# Patient Record
Sex: Female | Born: 1945
Health system: Southern US, Community
[De-identification: ages and names within clinical notes are randomized; demographics above are authoritative.]

## PROBLEM LIST (undated history)

## (undated) DIAGNOSIS — K219 Gastro-esophageal reflux disease without esophagitis: Secondary | ICD-10-CM

## (undated) DIAGNOSIS — I6529 Occlusion and stenosis of unspecified carotid artery: Secondary | ICD-10-CM

## (undated) DIAGNOSIS — E669 Obesity, unspecified: Secondary | ICD-10-CM

## (undated) DIAGNOSIS — I10 Essential (primary) hypertension: Secondary | ICD-10-CM

## (undated) DIAGNOSIS — E785 Hyperlipidemia, unspecified: Secondary | ICD-10-CM

## (undated) DIAGNOSIS — C801 Malignant (primary) neoplasm, unspecified: Secondary | ICD-10-CM

## (undated) HISTORY — DX: Essential (primary) hypertension: I10

## (undated) HISTORY — DX: Obesity, unspecified: E66.9

## (undated) HISTORY — DX: Hyperlipidemia, unspecified: E78.5

## (undated) HISTORY — DX: Occlusion and stenosis of unspecified carotid artery: I65.29

## (undated) HISTORY — PX: ABDOMINAL HYSTERECTOMY: SHX81

---

## 2001-05-28 ENCOUNTER — Other Ambulatory Visit: Admission: RE | Admit: 2001-05-28 | Discharge: 2001-05-28 | Payer: Self-pay | Admitting: Family Medicine

## 2001-06-08 ENCOUNTER — Ambulatory Visit (HOSPITAL_COMMUNITY): Admission: RE | Admit: 2001-06-08 | Discharge: 2001-06-08 | Payer: Self-pay | Admitting: General Surgery

## 2002-06-20 ENCOUNTER — Emergency Department (HOSPITAL_COMMUNITY): Admission: EM | Admit: 2002-06-20 | Discharge: 2002-06-20 | Payer: Self-pay | Admitting: *Deleted

## 2004-07-23 ENCOUNTER — Ambulatory Visit (HOSPITAL_COMMUNITY): Admission: RE | Admit: 2004-07-23 | Discharge: 2004-07-23 | Payer: Self-pay | Admitting: Family Medicine

## 2005-12-29 HISTORY — PX: CAROTID ENDARTERECTOMY: SUR193

## 2006-09-14 ENCOUNTER — Emergency Department (HOSPITAL_COMMUNITY): Admission: EM | Admit: 2006-09-14 | Discharge: 2006-09-14 | Payer: Self-pay | Admitting: *Deleted

## 2006-09-15 ENCOUNTER — Ambulatory Visit (HOSPITAL_COMMUNITY): Admission: RE | Admit: 2006-09-15 | Discharge: 2006-09-15 | Payer: Self-pay | Admitting: *Deleted

## 2006-10-09 ENCOUNTER — Encounter (INDEPENDENT_AMBULATORY_CARE_PROVIDER_SITE_OTHER): Payer: Self-pay | Admitting: *Deleted

## 2006-10-09 ENCOUNTER — Ambulatory Visit (HOSPITAL_COMMUNITY): Admission: RE | Admit: 2006-10-09 | Discharge: 2006-10-10 | Payer: Self-pay | Admitting: Vascular Surgery

## 2007-07-05 ENCOUNTER — Ambulatory Visit: Payer: Self-pay | Admitting: Vascular Surgery

## 2007-08-17 ENCOUNTER — Ambulatory Visit (HOSPITAL_COMMUNITY): Admission: RE | Admit: 2007-08-17 | Discharge: 2007-08-17 | Payer: Self-pay | Admitting: Internal Medicine

## 2008-06-03 ENCOUNTER — Emergency Department (HOSPITAL_COMMUNITY): Admission: EM | Admit: 2008-06-03 | Discharge: 2008-06-03 | Payer: Self-pay | Admitting: Emergency Medicine

## 2008-07-14 ENCOUNTER — Ambulatory Visit: Payer: Self-pay | Admitting: Vascular Surgery

## 2008-12-08 ENCOUNTER — Ambulatory Visit (HOSPITAL_COMMUNITY): Admission: RE | Admit: 2008-12-08 | Discharge: 2008-12-08 | Payer: Self-pay | Admitting: Internal Medicine

## 2009-07-13 ENCOUNTER — Ambulatory Visit: Payer: Self-pay | Admitting: Vascular Surgery

## 2009-12-03 ENCOUNTER — Emergency Department (HOSPITAL_COMMUNITY): Admission: EM | Admit: 2009-12-03 | Discharge: 2009-12-03 | Payer: Self-pay | Admitting: Emergency Medicine

## 2009-12-07 ENCOUNTER — Ambulatory Visit (HOSPITAL_COMMUNITY): Admission: RE | Admit: 2009-12-07 | Discharge: 2009-12-07 | Payer: Self-pay | Admitting: Emergency Medicine

## 2010-06-17 ENCOUNTER — Ambulatory Visit (HOSPITAL_COMMUNITY): Admission: RE | Admit: 2010-06-17 | Discharge: 2010-06-17 | Payer: Self-pay | Admitting: Internal Medicine

## 2011-05-13 NOTE — Procedures (Signed)
CAROTID DUPLEX EXAM   INDICATION:  Followup of carotid artery disease.   HISTORY:  Diabetes:  No  Cardiac:  No  Hypertension:  No  Smoking:  Yes  Previous Surgery:  Left CEA with DPA in 2007  CV History:  No  Amaurosis Fugax No, Paresthesias No, Hemiparesis No                                       RIGHT             LEFT  Brachial systolic pressure:         171               173  Brachial Doppler waveforms:         Biphasic          Biphasic  Vertebral direction of flow:        Antegrade         Antegrade  DUPLEX VELOCITIES (cm/sec)  CCA peak systolic                   95                101  ECA peak systolic                   205               236  ICA peak systolic                   121               96  ICA end diastolic                   46                46  PLAQUE MORPHOLOGY:                  Mixed             Mixed in ECA  PLAQUE AMOUNT:                      Mild              Mild  PLAQUE LOCATION:                    Right ICA/ECA     ECA   IMPRESSION:  1. The right internal carotid artery shows evidence of 40-59%      stenosis.  2. The left internal carotid artery shows no evidence of restenosis,      status post carotid endarterectomy.  3. Evidence of bilateral external carotid artery stenosis.   ___________________________________________  Larina Earthly, M.D.   AS/MEDQ  D:  07/05/2007  T:  07/06/2007  Job:  [id number]

## 2011-05-13 NOTE — Procedures (Signed)
CAROTID DUPLEX EXAM   INDICATION:  Follow-up evaluation of known carotid artery disease.   HISTORY:  Diabetes:  No.  Cardiac:  No.  Hypertension:  No.  Smoking:  Five cigarettes per day.  Previous Surgery:  Left carotid endarterectomy with Dacron patch  angioplasty on October 09, 2006 by Dr. Arbie Cookey.  CV History:  On July 05, 2007, patient had a 40-59% right ICA stenosis  and no left ICA stenosis, status post endarterectomy.  Patient complains  of no cerebrovascular symptoms at this time.  Amaurosis Fugax No, Paresthesias No, Hemiparesis No.                                       RIGHT             LEFT  Brachial systolic pressure:         180               188  Brachial Doppler waveforms:         Triphasic         Triphasic  Vertebral direction of flow:        Antegrade         Antegrade  DUPLEX VELOCITIES (cm/sec)  CCA peak systolic                   97                121  ECA peak systolic                   222               142  ICA peak systolic                   115               119  ICA end diastolic                   44                31  PLAQUE MORPHOLOGY:                  Soft              Mixed  PLAQUE AMOUNT:                      Mild-to-moderate  Mild  PLAQUE LOCATION:                    Proximal ICA      Proximal ECA, ICA   IMPRESSION:  1. 40-59% right internal carotid artery stenosis.  2. 20-39% left internal carotid artery stenosis, status post      endarterectomy.  3. Right external carotid artery stenosis.   ___________________________________________  Larina Earthly, M.D.   MC/MEDQ  D:  07/14/2008  T:  07/14/2008  Job:  [id number]

## 2011-05-13 NOTE — Procedures (Signed)
CAROTID DUPLEX EXAM   INDICATION:  Carotid disease.   HISTORY:  Diabetes:  No.  Cardiac:  No.  Hypertension:  No.  Smoking:  Yes.  Previous Surgery:  Left carotid endarterectomy in 10/09/2006.  CV History:  Currently asymptomatic.  Amaurosis Fugax No, Paresthesias No, Hemiparesis No.                                       RIGHT             LEFT  Brachial systolic pressure:         72                170  Brachial Doppler waveforms:         Normal            Normal  Vertebral direction of flow:        Antegrade         Antegrade  DUPLEX VELOCITIES (cm/sec)  CCA peak systolic                   118               119  ECA peak systolic                   313               208  ICA peak systolic                   150               105  ICA end diastolic                   60                25  PLAQUE MORPHOLOGY:                  Mixed             Heterogeneous  PLAQUE AMOUNT:                      Moderate          Moderate  PLAQUE LOCATION:                    ICA/ECA           ECA   IMPRESSION:  1. A 40% to 59% stenosis of the right internal carotid artery.  2. Patent left carotid endarterectomy site with no evidence of      stenosis in the left internal carotid artery.  3. Bilateral external carotid artery stenoses noted.  4. No significant change noted when compared to the previous exams.   ___________________________________________  Larina Earthly, M.D.   CH/MEDQ  D:  07/16/2009  T:  07/17/2009  Job:  [id number]

## 2011-05-13 NOTE — Assessment & Plan Note (Signed)
OFFICE VISIT   Taylor Banks  DOB:  06-03-1946                                       07/05/2007  MWNUU#:[id number]   The patient presents today for evaluation and followup of her left  carotid endarterectomy, Dacron patch angioplasty on October 09, 2006.  She seems to be doing quite well with no neurological deficits.  She has  dramatically reduced her cigarette smoking, but has not entirely quit  cigarette smoking.  She has had no neurological deficits, specifically  no amaurosis fugax, change in TIA, or strokes.  She does continue to  have elevated blood pressure, but is not currently on any medication  with borderline hypertension.   PHYSICAL EXAM:  She is a well-developed, well-nourished white female  appearing stated age of 35.  Blood pressure is 164/93, pulse 78,  respirations 16.  Radial pulses 2+ bilaterally.  She has a well-healed left carotid incision with no bruits bilaterally.   She underwent repeat carotid duplex, which reveals no evidence of  stenosis in her left carotid system.  She has moderate 40-59% stenosis  in her right carotid system.   I have, again, instructed her on the importance of cigarette smoking  cessation.  She will continue to work on this.  We plan to see her again  in 1 year with carotid duplex followup.  She will notify us if she  develops any neurological deficits.   Taylor Banks, M.D.  Electronically Signed   TFE/MEDQ  D:  07/05/2007  T:  07/06/2007  Job:  [id number]

## 2011-05-16 NOTE — H&P (Signed)
NAMETAITE, BALDASSARI NO.:  [id number]   MEDICAL RECORD NO.:  [id number]          PATIENT TYPE:  AMB   LOCATION:  SDS                          FACILITY:  [facility]   PHYSICIAN:  Larina Earthly, M.D.    DATE OF BIRTH:  [birth year]   DATE OF ADMISSION:  10/09/2006  DATE OF DISCHARGE:                                HISTORY & PHYSICAL   CHIEF COMPLAINT:  Asymptomatic left ICA stenosis.   HISTORY OF PRESENT ILLNESS:  The patient is a pleasant 65 year old Caucasian  female with a past medical history of hyperlipidemia and tobacco use.  The  patient recently presented to the emergency department complaining of severe  pain behind her right ear.  At that time, the patient was found to have a  carotid bruit.  In the emergency room, carotid duplex ultrasound was ordered  and done, which revealed mild to moderate right and severe left internal  carotid artery stenosis.  Following this, the patient presented back to her  primary care physician who placed her on Plavix and referred the patient to  Dr. Arbie Cookey.  The patient denies any headaches, nausea, vomiting, vertigo,  dizziness, history of seizures, numbness, tingling, muscle weakness,  dysarthria or dysphagia, visual changes, syncope, pre-syncope, memory loss  or confusion.  She was seen and evaluated by Dr. Arbie Cookey on Monday, October 05, 2006.   PAST MEDICAL HISTORY:  Hyperlipidemia.   PAST SURGICAL HISTORY:  Status post hysterectomy.   ALLERGIES:  NO KNOWN DRUG ALLERGIES.   MEDICATIONS:  1. Plavix 75 mg daily; stopped October 05, 2006.  2. Aspirin 81 mg daily.  3. __________ daily.  4. Vytorin daily.   SOCIAL HISTORY:  The patient is married and has 3 children.  Lives at home  with family.  History of tobacco use; she has smoked 1 pack per day over the  past 35 years.  Denies any alcohol use.  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   FAMILY HISTORY:  Mother deceased at age 86, positive for  hypertension,  cerebrovascular accident and diabetes mellitus.  Father deceased at age 55,  positive for myocardial infarction.   REVIEW OF SYSTEMS:  See HPI for pertinent positives and negatives.  The  patient denies any recent fevers, night sweats or chills.  Denies any recent  changes in vision or hearing, or difficulty swallowing.  Denies any  coughing, hemoptysis or wheezing.  Denies any chest pain, palpitations,  lightheadedness, dizziness, orthopnea, or paroxysmal nocturnal dyspnea.  Denies any nausea, vomiting, abdominal pain, changes in bowel movements,  diarrhea, constipation, melena, hematemesis or hematochezia.  Denies any  urgency, frequency, dysuria or hematuria.  Denies any muscle weakness,  aches, or claudication symptoms.   PHYSICAL EXAMINATION:  GENERAL:  Well-developed, well-nourished white female  in no acute distress.  VITAL SIGNS:  Blood pressure is 140/82, pulse is 78, and respirations are  18.  HEENT:  Normocephalic, atraumatic.  Pupils equal, round and reactive to  light and accommodation.  Extraocular movements are intact.  Oral mucosa is  pink and moist.  NECK:  Supple.  There are bilateral  carotid bruits noted.  RESPIRATORY:  Clear to auscultation bilaterally.  CARDIAC:  Regular rate and rhythm.  S1 and S2 noted.  There is a 2/6  systolic ejection murmur noted.  ABDOMEN:  Bowel sounds times four.  Soft and nontender to palpation.  No  hepatosplenomegaly noted.  GENITOURINARY:  Deferred.  RECTAL:  Deferred.  EXTREMITIES:  No edema, cyanosis or clubbing noted.  Patient has bilateral  warm extremities.  Has 2+ bilateral radial, femoral, dorsalis pedis and  posterior tibial pulses noted.  NEUROLOGIC:  Cranial nerves II through XII are intact.  The patient is alert  and oriented times four.  Gait is steady.  Muscle strength is 5/5 in upper  and lower extremities bilaterally.   STUDIES:  Carotid duplex ultrasound results are currently not available.    IMPRESSION AND PLAN:  The patient is seen with left internal carotid artery  stenosis.  She was seen and evaluated per Dr. Arbie Cookey.  Dr. Arbie Cookey discussed  with the patient undergoing left carotid endarterectomy.  He discussed risks  and benefits of this procedure with the patient.  The patient acknowledged  her understanding and agreed to proceed.  Surgery was scheduled for October 09, 2006.  The patient has undergone her preoperative testing this morning,  October 08, 2006.   The patient was instructed that should she develop any sudden weakness,  slurred speech, symptoms of amaurosis fugax, she should present to the  emergency room.  She acknowledged her understanding.      Theda [place], [place]      Larina Earthly, M.D.  Electronically Signed    KMD/MEDQ  D:  10/08/2006  T:  10/08/2006  Job:  [id number]

## 2011-05-16 NOTE — Op Note (Signed)
NAMEJENIYAH, Taylor Banks NO.:  [id number]   MEDICAL RECORD NO.:  [id number]          PATIENT TYPE:  INP   LOCATION:  3302                         FACILITY:  [facility]   PHYSICIAN:  Larina Earthly, M.D.    DATE OF BIRTH:  Jul 11, 1946   DATE OF PROCEDURE:  10/09/2006  DATE OF DISCHARGE:  10/10/2006                                 OPERATIVE REPORT   PREOPERATIVE DIAGNOSIS:  Severe asymptomatic left internal carotid artery  stenosis.   POSTOPERATIVE DIAGNOSIS:  Severe asymptomatic left internal carotid artery  stenosis.   PROCEDURES:  Left carotid endarterectomy and Dacron patch angioplasty.   SURGEON:  Larina Earthly, M.D.   ASSISTANT:  Coral Ceo, PA-C.   ANESTHESIA:  General endotracheal.   COMPLICATIONS:  None.  To recovery room stable.   PROCEDURE IN DETAIL:  The patient was taken to the OR and positioned in the  supine position, where the area of the left neck was prepped and draped in  usual sterile fashion.  An incision was made at the anterior  sternocleidomastoid and carried down through the platysma with  electrocautery __________ sternocleidomastoid posteriorly.  The carotid  sheath was opened.  The facial vein was ligated with 2-0 silk ties and  divided.  The hypoglossal and vagus nerves were identified and preserved.  Dissection was tented onto the bifurcation.  The common carotid artery was  encircled with umbilical tape and Rumel tourniquet.  The external carotid  was encircled with a vessel loop.  The internal carotid circled with  umbilical tape and Rumel tourniquet, and the superior thyroid artery was  encircled with a 2-0 silk Potts tie.  The patient was given 7000 units of  intravenous heparin.  After adequate circulation time, the internal,  external and common carotid arteries were occluded.  The common carotid  artery was opened with a #11 blade and extended longitudinally with Potts  scissors through the plaque onto the internal carotid  artery.  A 10 shunt  was passed up the internal carotid, allowed to back bleed; then down the  common carotid, where it was secured with Rumel tourniquets.  The patient  did have severe stenosis at the bifurcation.   Endarterectomy was begun on the common carotid artery, and the plaque was  divided proximally with Potts scissors.  The endarterectomy was continued on  to bifurcation the external carotid endarterectomy was a everse technique.  The internal carotid endarterectomized in open fashion.  Remaining  atheromatous debris was removed from the endarterectomy plane.  A Finesse  Hemashield Dacron patch was brought into the field, and sewn as a patch  angioplasty with a running 6-0 Prolene suture.  Prior to completion of the  anastomosis, the shunt was removed and the usual flush maneuvers were  undertaken.  The anastomosis was then completed and the flow restored first  to the external, and then the common; finally the internal carotid occlusion  clamp was removed.  Excellent flow  characteristics were noted with handheld Doppler in the internal and  external carotid arteries.  The patient was given 50 mg of protamine to  reverse heparin.  The wounds were irrigated with saline and antiseptic  cautery.  The wounds were closed with 3-0 Vicryl in the subcutaneous and  subcuticular tissue.  Benzoin and Steri-Strips were applied.      Larina Earthly, M.D.  Electronically Signed     TFE/MEDQ  D:  10/09/2006  T:  10/11/2006  Job:  [id number]

## 2011-08-25 ENCOUNTER — Other Ambulatory Visit (HOSPITAL_COMMUNITY): Payer: Self-pay | Admitting: Internal Medicine

## 2011-08-25 DIAGNOSIS — Z139 Encounter for screening, unspecified: Secondary | ICD-10-CM

## 2011-09-02 ENCOUNTER — Ambulatory Visit (HOSPITAL_COMMUNITY)
Admission: RE | Admit: 2011-09-02 | Discharge: 2011-09-02 | Disposition: A | Discharge status: Home / Self Care | Payer: Medicare Other | Source: Ambulatory Visit | Attending: Internal Medicine | Admitting: Internal Medicine

## 2011-09-02 DIAGNOSIS — Z139 Encounter for screening, unspecified: Secondary | ICD-10-CM

## 2011-09-02 DIAGNOSIS — Z1231 Encounter for screening mammogram for malignant neoplasm of breast: Secondary | ICD-10-CM | POA: Insufficient documentation

## 2012-11-22 ENCOUNTER — Other Ambulatory Visit (HOSPITAL_COMMUNITY): Payer: Self-pay | Admitting: Internal Medicine

## 2012-11-22 DIAGNOSIS — Z139 Encounter for screening, unspecified: Secondary | ICD-10-CM

## 2012-11-23 ENCOUNTER — Ambulatory Visit (HOSPITAL_COMMUNITY)
Admission: RE | Admit: 2012-11-23 | Discharge: 2012-11-23 | Disposition: A | Discharge status: Home / Self Care | Payer: Medicare Other | Source: Ambulatory Visit | Attending: Internal Medicine | Admitting: Internal Medicine

## 2012-11-23 DIAGNOSIS — Z1231 Encounter for screening mammogram for malignant neoplasm of breast: Secondary | ICD-10-CM | POA: Insufficient documentation

## 2012-11-23 DIAGNOSIS — Z139 Encounter for screening, unspecified: Secondary | ICD-10-CM

## 2012-11-23 HISTORY — PX: DIAGNOSTIC MAMMOGRAM: HXRAD719

## 2012-12-24 ENCOUNTER — Encounter: Payer: Self-pay | Admitting: Vascular Surgery

## 2013-01-04 ENCOUNTER — Other Ambulatory Visit (HOSPITAL_COMMUNITY): Payer: Self-pay | Admitting: Internal Medicine

## 2013-01-04 DIAGNOSIS — Z9889 Other specified postprocedural states: Secondary | ICD-10-CM

## 2013-01-04 DIAGNOSIS — R0989 Other specified symptoms and signs involving the circulatory and respiratory systems: Secondary | ICD-10-CM

## 2013-01-04 DIAGNOSIS — E669 Obesity, unspecified: Secondary | ICD-10-CM

## 2013-01-04 DIAGNOSIS — Z0000 Encounter for general adult medical examination without abnormal findings: Secondary | ICD-10-CM

## 2013-01-04 HISTORY — DX: Obesity, unspecified: E66.9

## 2013-01-07 ENCOUNTER — Ambulatory Visit (HOSPITAL_COMMUNITY)
Admission: RE | Admit: 2013-01-07 | Discharge: 2013-01-07 | Disposition: A | Discharge status: Home / Self Care | Payer: Medicare Other | Source: Ambulatory Visit | Attending: Internal Medicine | Admitting: Internal Medicine

## 2013-01-07 DIAGNOSIS — M949 Disorder of cartilage, unspecified: Secondary | ICD-10-CM | POA: Insufficient documentation

## 2013-01-07 DIAGNOSIS — Z780 Asymptomatic menopausal state: Secondary | ICD-10-CM | POA: Insufficient documentation

## 2013-01-07 DIAGNOSIS — R0989 Other specified symptoms and signs involving the circulatory and respiratory systems: Secondary | ICD-10-CM | POA: Insufficient documentation

## 2013-01-07 DIAGNOSIS — I6529 Occlusion and stenosis of unspecified carotid artery: Secondary | ICD-10-CM | POA: Insufficient documentation

## 2013-01-07 DIAGNOSIS — Z9889 Other specified postprocedural states: Secondary | ICD-10-CM

## 2013-01-07 DIAGNOSIS — Z0000 Encounter for general adult medical examination without abnormal findings: Secondary | ICD-10-CM

## 2013-01-07 DIAGNOSIS — M899 Disorder of bone, unspecified: Secondary | ICD-10-CM | POA: Insufficient documentation

## 2013-01-14 ENCOUNTER — Encounter: Payer: Self-pay | Admitting: Vascular Surgery

## 2013-01-31 ENCOUNTER — Encounter: Payer: Self-pay | Admitting: Vascular Surgery

## 2013-02-01 ENCOUNTER — Encounter: Payer: Self-pay | Admitting: Vascular Surgery

## 2013-02-01 ENCOUNTER — Ambulatory Visit (INDEPENDENT_AMBULATORY_CARE_PROVIDER_SITE_OTHER): Payer: Medicare Other | Admitting: Vascular Surgery

## 2013-02-01 VITALS — BP 177/83 | HR 67 | Resp 18 | Ht 61.0 in | Wt 169.7 lb

## 2013-02-01 DIAGNOSIS — I6529 Occlusion and stenosis of unspecified carotid artery: Secondary | ICD-10-CM

## 2013-02-01 NOTE — Progress Notes (Signed)
[facility]   Patient name: Taylor Banks MRN: [id number] DOB: 11-Aug-1946 Sex: female   Referred by: Sherwood Gambler  Reason for referral:  Chief Complaint  Patient presents with  . Follow-up  . Carotid    HISTORY OF PRESENT ILLNESS: Patient is a very pleasant 67 year old female who I know from her prior left carotid endarterectomy for severe asymptomatic carotid disease in October of 2007. She did well following the surgery and had been seen her office for several years for followup of her moderate asymptomatic right carotid stenosis. She had been lost to followup from [place] since 2010. She reports that she had a period of time when she did not have insurance before beginning to proceed Medicare benefits and this continued followup. She was recently seen by Dr.Fusco who noted a right carotid bruit and scheduled carotid duplex. This was in January 2014 and I have her study for review. This does show no significant change since our last visit with her and October of 2010 with moderate right carotid stenosis. She continues to have widely patent left carotid endarterectomy. She specifically denies any gross fugax transient ischemic attacks or strokes. He has a remained stable with hypertension and hyperlipidemia. She reports that she checks her blood pressure at home but remains in the 01/28/1939 range systolic  Past Medical History  Diagnosis Date  . Obesity Jan. 7, 2014  . Hyperlipidemia   . Hypertension   . Carotid artery occlusion     with Bruit- Left and 50% stenosis right  carotid    Past Surgical History  Procedure Date  . Carotid endarterectomy     LEFT w/Bruit  . Diagnostic mammogram Nov. 26,2013  . Abdominal hysterectomy     Partial    History   Social History  . Marital Status: Married    Spouse Name: N/A    Number of Children: N/A  . Years of Education: N/A   Occupational History  . Not on file.   Social History Main Topics  . Smoking  status: Former Smoker -- 0.5 packs/day for 3 years    Types: Cigarettes    Quit date: 12/29/2012  . Smokeless tobacco: Never Used  . Alcohol Use: No  . Drug Use: No  . Sexually Active:    Other Topics Concern  . Not on file   Social History Narrative  . No narrative on file    Family History  Problem Relation Age of Onset  . Stroke Mother   . Stroke Father   . Diabetes Sister   . Diabetes Sister     Allergies as of 02/01/2013  . (No Known Allergies)    Current Outpatient Prescriptions on File Prior to Visit  Medication Sig Dispense Refill  . alendronate (FOSAMAX) 35 MG tablet Take 35 mg by mouth every 7 (seven) days. Take with a full glass of water on an empty stomach.      Marland Kitchen aspirin 325 MG tablet Take 325 mg by mouth daily.      . pravastatin (PRAVACHOL) 20 MG tablet Take 20 mg by mouth daily.         REVIEW OF SYSTEMS:  Positives indicated with an "X"  CARDIOVASCULAR:  [ ]  chest pain   [ ]  chest pressure   [ ]  palpitations   [ ]  orthopnea   [ ]  dyspnea on exertion   [ ]  claudication   [ ]  rest pain   [ ]  DVT   [ ]  phlebitis  PULMONARY:   [ ]  productive cough   [ ]  asthma   [ ]  wheezing NEUROLOGIC:   [ ]  weakness  [ ]  paresthesias  [ ]  aphasia  [ ]  amaurosis  [ ]  dizziness HEMATOLOGIC:   [ ]  bleeding problems   [ ]  clotting disorders MUSCULOSKELETAL:  [ ]  joint pain   [ ]  joint swelling GASTROINTESTINAL: [ ]   blood in stool  [ ]   hematemesis GENITOURINARY:  [ ]   dysuria  [ ]   hematuria PSYCHIATRIC:  [ ]  history of major depression INTEGUMENTARY:  [ ]  rashes  [ ]  ulcers CONSTITUTIONAL:  [ ]  fever   [ ]  chills  PHYSICAL EXAMINATION:  General: The patient is a well-nourished female, in no acute distress. Vital signs are BP 177/83  Pulse 67  Resp 18  Ht 5\' 1"  (1.549 m)  Wt 169 lb 11.2 oz (76.975 kg)  BMI 32.06 kg/m2 Pulmonary: There is a good air exchange bilaterally without wheezing or rales. Abdomen: Soft and non-tender with normal pitch bowel  sounds. Musculoskeletal: There are no major deformities.  There is no significant extremity pain. Neurologic: No focal weakness or paresthesias are detected, Skin: There are no ulcer or rashes noted. Carotid: Soft right bruit, no bruit on the left. Well-healed left carotid incision with normal pulse 2+ radial pulses bilaterally    Psychiatric: The patient has normal affect. Cardiovascular: There is a regular rate and rhythm without significant murmur appreciated.     Impression and Plan:  Stable asymptomatic right moderate carotid stenosis. No recurrence of left carotid endarterectomy. I discussed this at length with the patient. I have recommended yearly carotid duplex. She wishes to have [place] follow this year in the office and we will see her again in one year with repeat carotid duplex. I again reviewed symptoms of carotid disease with her. She will notify us should this occur    Deval Mroczka [facility] [place] Office: [phone]

## 2013-02-01 NOTE — Addendum Note (Signed)
Addended by: Dannielle Karvonen on: 02/01/2013 04:38 PM   Modules accepted: Orders

## 2014-01-31 ENCOUNTER — Encounter: Payer: Self-pay | Admitting: Family

## 2014-02-01 ENCOUNTER — Ambulatory Visit (INDEPENDENT_AMBULATORY_CARE_PROVIDER_SITE_OTHER): Payer: Medicare Other | Admitting: Family

## 2014-02-01 ENCOUNTER — Encounter: Payer: Self-pay | Admitting: Family

## 2014-02-01 ENCOUNTER — Ambulatory Visit (HOSPITAL_COMMUNITY)
Admission: RE | Admit: 2014-02-01 | Discharge: 2014-02-01 | Disposition: A | Discharge status: Home / Self Care | Payer: Medicare Other | Source: Ambulatory Visit | Attending: Family | Admitting: Family

## 2014-02-01 VITALS — BP 141/72 | HR 72 | Resp 16 | Ht 62.0 in | Wt 164.0 lb

## 2014-02-01 DIAGNOSIS — Z48812 Encounter for surgical aftercare following surgery on the circulatory system: Secondary | ICD-10-CM

## 2014-02-01 DIAGNOSIS — Z9889 Other specified postprocedural states: Secondary | ICD-10-CM | POA: Insufficient documentation

## 2014-02-01 DIAGNOSIS — I6529 Occlusion and stenosis of unspecified carotid artery: Secondary | ICD-10-CM

## 2014-02-01 NOTE — Patient Instructions (Signed)
Stroke Prevention Some medical conditions and behaviors are associated with an increased chance of having a stroke. You may prevent a stroke by making healthy choices and managing medical conditions. HOW CAN I REDUCE MY RISK OF HAVING A STROKE?   Stay physically active. Get at least 30 minutes of activity on most or all days.  Do not smoke. It may also be helpful to avoid exposure to secondhand smoke.  Limit alcohol use. Moderate alcohol use is considered to be:  No more than 2 drinks per day for men.  No more than 1 drink per day for nonpregnant women.  Eat healthy foods. This involves  Eating 5 or more servings of fruits and vegetables a day.  Following a diet that addresses high blood pressure (hypertension), high cholesterol, diabetes, or obesity.  Manage your cholesterol levels.  A diet low in saturated fat, trans fat, and cholesterol and high in fiber may control cholesterol levels.  Take any prescribed medicines to control cholesterol as directed by your health care provider.  Manage your diabetes.  A controlled-carbohydrate, controlled-sugar diet is recommended to manage diabetes.  Take any prescribed medicines to control diabetes as directed by your health care provider.  Control your hypertension.  A low-salt (sodium), low-saturated fat, low-trans fat, and low-cholesterol diet is recommended to manage hypertension.  Take any prescribed medicines to control hypertension as directed by your health care provider.  Maintain a healthy weight.  A reduced-calorie, low-sodium, low-saturated fat, low-trans fat, low-cholesterol diet is recommended to manage weight.  Stop drug abuse.  Avoid taking birth control pills.  Talk to your health care provider about the risks of taking birth control pills if you are over 35 years old, smoke, get migraines, or have ever had a blood clot.  Get evaluated for sleep disorders (sleep apnea).  Talk to your health care provider about  getting a sleep evaluation if you snore a lot or have excessive sleepiness.  Take medicines as directed by your health care provider.  For some people, aspirin or blood thinners (anticoagulants) are helpful in reducing the risk of forming abnormal blood clots that can lead to stroke. If you have the irregular heart rhythm of atrial fibrillation, you should be on a blood thinner unless there is a good reason you cannot take them.  Understand all your medicine instructions.  Make sure that other other conditions (such as anemia or atherosclerosis) are addressed. SEEK IMMEDIATE MEDICAL CARE IF:   You have sudden weakness or numbness of the face, arm, or leg, especially on one side of the body.  Your face or eyelid droops to one side.  You have sudden confusion.  You have trouble speaking (aphasia) or understanding.  You have sudden trouble seeing in one or both eyes.  You have sudden trouble walking.  You have dizziness.  You have a loss of balance or coordination.  You have a sudden, severe headache with no known cause.  You have new chest pain or an irregular heartbeat. Any of these symptoms may represent a serious problem that is an emergency. Do not wait to see if the symptoms will go away. Get medical help at once. Call your local emergency services  (911 in [place] Do not drive yourself to the hospital. Document Released: 01/22/2005 Document Revised: 10/05/2013 Document Reviewed: 06/17/2013 ExitCare Patient Information 2014 [employer].   Smoking Cessation Quitting smoking is important to your health and has many advantages. However, it is not always easy to quit since nicotine is a   very addictive drug. Often times, people try 3 times or more before being able to quit. This document explains the best ways for you to prepare to quit smoking. Quitting takes hard work and a lot of effort, but you can do it. ADVANTAGES OF QUITTING SMOKING  You will live longer, feel better,  and live better.  Your body will feel the impact of quitting smoking almost immediately.  Within 20 minutes, blood pressure decreases. Your pulse returns to its normal level.  After 8 hours, carbon monoxide levels in the blood return to normal. Your oxygen level increases.  After 24 hours, the chance of having a heart attack starts to decrease. Your breath, hair, and body stop smelling like smoke.  After 48 hours, damaged nerve endings begin to recover. Your sense of taste and smell improve.  After 72 hours, the body is virtually free of nicotine. Your bronchial tubes relax and breathing becomes easier.  After 2 to 12 weeks, lungs can hold more air. Exercise becomes easier and circulation improves.  The risk of having a heart attack, stroke, cancer, or lung disease is greatly reduced.  After 1 year, the risk of coronary heart disease is cut in half.  After 5 years, the risk of stroke falls to the same as a nonsmoker.  After 10 years, the risk of lung cancer is cut in half and the risk of other cancers decreases significantly.  After 15 years, the risk of coronary heart disease drops, usually to the level of a nonsmoker.  If you are pregnant, quitting smoking will improve your chances of having a healthy baby.  The people you live with, especially any children, will be healthier.  You will have extra money to spend on things other than cigarettes. QUESTIONS TO THINK ABOUT BEFORE ATTEMPTING TO QUIT You may want to talk about your answers with your caregiver.  Why do you want to quit?  If you tried to quit in the past, what helped and what did not?  What will be the most difficult situations for you after you quit? How will you plan to handle them?  Who can help you through the tough times? Your family? Friends? A caregiver?  What pleasures do you get from smoking? What ways can you still get pleasure if you quit? Here are some questions to ask your caregiver:  How can you  help me to be successful at quitting?  What medicine do you think would be best for me and how should I take it?  What should I do if I need more help?  What is smoking withdrawal like? How can I get information on withdrawal? GET READY  Set a quit date.  Change your environment by getting rid of all cigarettes, ashtrays, matches, and lighters in your home, car, or work. Do not let people smoke in your home.  Review your past attempts to quit. Think about what worked and what did not. GET SUPPORT AND ENCOURAGEMENT You have a better chance of being successful if you have help. You can get support in many ways.  Tell your family, friends, and co-workers that you are going to quit and need their support. Ask them not to smoke around you.  Get individual, group, or telephone counseling and support. Programs are available at local hospitals and health centers. Call your local health department for information about programs in your area.  Spiritual beliefs and practices may help some smokers quit.  Download a "quit meter" on your computer   to keep track of quit statistics, such as how long you have gone without smoking, cigarettes not smoked, and money saved.  Get a self-help book about quitting smoking and staying off of tobacco. LEARN NEW SKILLS AND BEHAVIORS  Distract yourself from urges to smoke. Talk to someone, go for a walk, or occupy your time with a task.  Change your normal routine. Take a different route to work. Drink tea instead of coffee. Eat breakfast in a different place.  Reduce your stress. Take a hot bath, exercise, or read a book.  Plan something enjoyable to do every day. Reward yourself for not smoking.  Explore interactive web-based programs that specialize in helping you quit. GET MEDICINE AND USE IT CORRECTLY Medicines can help you stop smoking and decrease the urge to smoke. Combining medicine with the above behavioral methods and support can greatly increase  your chances of successfully quitting smoking.  Nicotine replacement therapy helps deliver nicotine to your body without the negative effects and risks of smoking. Nicotine replacement therapy includes nicotine gum, lozenges, inhalers, nasal sprays, and skin patches. Some may be available over-the-counter and others require a prescription.  Antidepressant medicine helps people abstain from smoking, but how this works is unknown. This medicine is available by prescription.  Nicotinic receptor partial agonist medicine simulates the effect of nicotine in your brain. This medicine is available by prescription. Ask your caregiver for advice about which medicines to use and how to use them based on your health history. Your caregiver will tell you what side effects to look out for if you choose to be on a medicine or therapy. Carefully read the information on the package. Do not use any other product containing nicotine while using a nicotine replacement product.  RELAPSE OR DIFFICULT SITUATIONS Most relapses occur within the first 3 months after quitting. Do not be discouraged if you start smoking again. Remember, most people try several times before finally quitting. You may have symptoms of withdrawal because your body is used to nicotine. You may crave cigarettes, be irritable, feel very hungry, cough often, get headaches, or have difficulty concentrating. The withdrawal symptoms are only temporary. They are strongest when you first quit, but they will go away within 10 14 days. To reduce the chances of relapse, try to:  Avoid drinking alcohol. Drinking lowers your chances of successfully quitting.  Reduce the amount of caffeine you consume. Once you quit smoking, the amount of caffeine in your body increases and can give you symptoms, such as a rapid heartbeat, sweating, and anxiety.  Avoid smokers because they can make you want to smoke.  Do not let weight gain distract you. Many smokers will gain  weight when they quit, usually less than 10 pounds. Eat a healthy diet and stay active. You can always lose the weight gained after you quit.  Find ways to improve your mood other than smoking. FOR MORE INFORMATION  www.smokefree.gov  Document Released: 12/09/2001 Document Revised: 06/15/2012 Document Reviewed: 03/25/2012 ExitCare Patient Information 2014 [employer].  

## 2014-02-01 NOTE — Progress Notes (Signed)
Established Carotid Patient   History of Present Illness  Taylor Banks is a 68 y.o. female patient of Dr. Donnetta Hutching who is s/p left CEA on 10/09/2006. She returns today for follow up. She states she does not use ETOH, and is physically active in her part time job.  Patient has Negative history of TIA or stroke symptom.  The patient denies amaurosis fugax or monocular blindness.  The patient  denies facial drooping.  Pt. denies hemiplegia.  The patient denies receptive or expressive aphasia.  Pt. denies extremity weakness.   Pt denies New Medical or Surgical History.  Pt Diabetic: No Pt smoker: smoker  (1/2 ppd x since her teens)  Pt meds include: Statin : Yes ASA: Yes Other anticoagulants/antiplatelets: no   Past Medical History  Diagnosis Date  . Obesity Jan. 7, 2014  . Hyperlipidemia   . Hypertension   . Carotid artery occlusion     with Bruit- Left and 50% stenosis right  carotid    Social History History  Substance Use Topics  . Smoking status: Former Smoker -- 0.50 packs/day for 3 years    Types: Cigarettes    Quit date: 12/29/2012  . Smokeless tobacco: Never Used  . Alcohol Use: No    Family History Family History  Problem Relation Age of Onset  . Stroke Mother   . Hypertension Mother   . Stroke Father   . Diabetes Sister   . Diabetes Sister     Surgical History Past Surgical History  Procedure Laterality Date  . Carotid endarterectomy      LEFT w/Bruit  . Diagnostic mammogram  Nov. 26,2013  . Abdominal hysterectomy      Partial    No Known Allergies  Current Outpatient Prescriptions  Medication Sig Dispense Refill  . aspirin 325 MG tablet Take 325 mg by mouth daily.      . Nutritional Supplements (VITAMIN D MAINTENANCE PO) Take by mouth daily.      Marland Kitchen alendronate (FOSAMAX) 35 MG tablet Take 35 mg by mouth every 7 (seven) days. Take with a full glass of water on an empty stomach.      . pravastatin (PRAVACHOL) 20 MG tablet Take 20 mg by  mouth daily.       No current facility-administered medications for this visit.    Review of Systems : See HPI for pertinent positives and negatives.  Physical Examination  Filed Vitals:   02/01/14 1513  BP: 141/72  Pulse: 72  Resp: 16   Filed Weights   02/01/14 1513  Weight: 164 lb (74.39 kg)   Body mass index is 29.99 kg/(m^2).  General: WDWN female in NAD GAIT: normal Eyes: PERRLA Pulmonary:  CTAB, Negative  Rales, Negative rhonchi, & transient inspiratory wheezing in right posterior fields.  Cardiac: regular Rhythm ,  Negative Murmurs.  VASCULAR EXAM Carotid Bruits Left Right   Negative Positive    Aorta is not palpable. Radial pulses are 2+ palpable and equal.  LE Pulses LEFT RIGHT       POPLITEAL  not palpable   not palpable       POSTERIOR TIBIAL  palpable    palpable        DORSALIS PEDIS      ANTERIOR TIBIAL  palpable  palpable     Gastrointestinal: soft, nontender, BS WNL, no r/g,  negative masses.  Musculoskeletal: Negative muscle atrophy/wasting. M/S 5/5 throughout, Extremities without ischemic changes.  Neurologic: A&O X 3; Appropriate Affect ; SENSATION ;normal;  Speech is normal CN 2-12 intact, Pain and light touch intact in extremities, Motor exam as listed above.   Non-Invasive Vascular Imaging CAROTID DUPLEX 02/01/2014   CEREBROVASCULAR DUPLEX EVALUATION    INDICATION: Carotid stenosis    PREVIOUS INTERVENTION(S): Left carotid endarterectomy 10/09/2006    DUPLEX EXAM:     RIGHT  LEFT  Peak Systolic Velocities (cm/s) End Diastolic Velocities (cm/s) Plaque LOCATION Peak Systolic Velocities (cm/s) End Diastolic Velocities (cm/s) Plaque  145 29  CCA PROXIMAL 127 26   82 25  CCA MID 96 28   101 31 HT CCA DISTAL 132 25 HM  281 50 HT ECA 162 20   185 73 HT ICA PROXIMAL 106 26 HM  159 53  ICA MID 88 36   97 32  ICA  DISTAL 86 39     2.26 ICA / CCA Ratio (PSV) carotid endarterectomy  Antegrade Vertebral Flow Antegrade  277 Brachial Systolic Pressure (mmHg) 824  Triphasic Brachial Artery Waveforms Triphasic    Plaque Morphology:  HM = Homogeneous, HT = Heterogeneous, CP = Calcific Plaque, SP = Smooth Plaque, IP = Irregular Plaque     ADDITIONAL FINDINGS:     IMPRESSION: Right internal carotid artery stenosis present in the 60%-79% range. Left internal carotid artery is patent with history of carotid endarterectomy, no hyperplasia or hemodynamically significant plaque present. Right external carotid artery stenosis present.    Compared to the previous exam:  Increase in disease on the right and stable on the left since previous study on 07/13/2009.    Assessment: [name] is a 68 y.o. female who presents with asymptomatic moderate/severe right ICA stenosis and patent left ICA which is the CEA site. The right ICA stenosis is  Worse from previous exam.  Plan: Follow-up in 6 months with Carotid Duplex scan. Patient counseled re smoking cessation.   I discussed in depth with the patient the nature of atherosclerosis, and emphasized the importance of maximal medical management including strict control of blood pressure, blood glucose, and lipid levels, obtaining regular exercise, and cessation of smoking.  The patient is aware that without maximal medical management the underlying atherosclerotic disease process will progress, limiting the benefit of any interventions. The patient was given information about stroke prevention and what symptoms should prompt the patient to seek immediate medical care. Thank you for allowing [place] to participate in this patient's care.  Clemon Chambers, RN, MSN, FNP-C [facility] Office: [phone]  Clinic Physician: Scot Dock  02/01/2014 3:43 PM

## 2014-08-01 ENCOUNTER — Encounter: Payer: Self-pay | Admitting: Family

## 2014-08-02 ENCOUNTER — Other Ambulatory Visit (HOSPITAL_COMMUNITY): Payer: Medicare Other

## 2014-08-02 ENCOUNTER — Ambulatory Visit: Payer: Medicare Other | Admitting: Family

## 2016-03-24 DIAGNOSIS — R070 Pain in throat: Secondary | ICD-10-CM | POA: Diagnosis not present

## 2016-03-24 DIAGNOSIS — R05 Cough: Secondary | ICD-10-CM | POA: Diagnosis not present

## 2016-03-24 DIAGNOSIS — Z1389 Encounter for screening for other disorder: Secondary | ICD-10-CM | POA: Diagnosis not present

## 2016-03-24 DIAGNOSIS — J343 Hypertrophy of nasal turbinates: Secondary | ICD-10-CM | POA: Diagnosis not present

## 2016-03-24 DIAGNOSIS — Z6828 Body mass index (BMI) 28.0-28.9, adult: Secondary | ICD-10-CM | POA: Diagnosis not present

## 2016-03-24 DIAGNOSIS — J029 Acute pharyngitis, unspecified: Secondary | ICD-10-CM | POA: Diagnosis not present

## 2016-12-19 ENCOUNTER — Other Ambulatory Visit (HOSPITAL_COMMUNITY): Payer: Self-pay | Admitting: Internal Medicine

## 2016-12-19 DIAGNOSIS — E119 Type 2 diabetes mellitus without complications: Secondary | ICD-10-CM | POA: Diagnosis not present

## 2016-12-19 DIAGNOSIS — E782 Mixed hyperlipidemia: Secondary | ICD-10-CM | POA: Diagnosis not present

## 2016-12-19 DIAGNOSIS — R0989 Other specified symptoms and signs involving the circulatory and respiratory systems: Secondary | ICD-10-CM

## 2016-12-19 DIAGNOSIS — Z6830 Body mass index (BMI) 30.0-30.9, adult: Secondary | ICD-10-CM | POA: Diagnosis not present

## 2016-12-19 DIAGNOSIS — I10 Essential (primary) hypertension: Secondary | ICD-10-CM | POA: Diagnosis not present

## 2016-12-19 DIAGNOSIS — E6609 Other obesity due to excess calories: Secondary | ICD-10-CM | POA: Diagnosis not present

## 2016-12-19 DIAGNOSIS — Z0001 Encounter for general adult medical examination with abnormal findings: Secondary | ICD-10-CM | POA: Diagnosis not present

## 2017-02-02 ENCOUNTER — Ambulatory Visit (HOSPITAL_COMMUNITY)
Admission: RE | Admit: 2017-02-02 | Discharge: 2017-02-02 | Disposition: A | Discharge status: Home / Self Care | Payer: PPO | Source: Ambulatory Visit | Attending: Internal Medicine | Admitting: Internal Medicine

## 2017-02-02 DIAGNOSIS — I6521 Occlusion and stenosis of right carotid artery: Secondary | ICD-10-CM | POA: Diagnosis not present

## 2017-02-02 DIAGNOSIS — R0989 Other specified symptoms and signs involving the circulatory and respiratory systems: Secondary | ICD-10-CM

## 2017-02-02 DIAGNOSIS — I6523 Occlusion and stenosis of bilateral carotid arteries: Secondary | ICD-10-CM | POA: Insufficient documentation

## 2017-03-11 ENCOUNTER — Encounter: Payer: Self-pay | Admitting: Cardiology

## 2017-03-11 NOTE — Progress Notes (Signed)
Cardiology Office Note  Date: 03/12/2017   ID: GEMINI BUNTE, DOB Jul 08, 1946, MRN [id number]  PCP: Glo Herring., MD  Consulting Cardiologist: Rozann Lesches, MD   Chief Complaint  Patient presents with  . Carotid artery disease    History of Present Illness: Taylor Banks is a 71 y.o. female referred for cardiology consultation by Dr. Gerarda Fraction. She presents following recent carotid Dopplers which show progressive disease as described below. She was followed in the past by [facility], underwent left CEA in 10/17/2006 by Dr. Donnetta Hutching. No visits since 2015. Doppler study was done at [facility] indicating greater than 70% RICA stenosis, increased velocities on the left with equivocal quantification and suggestion of possible CTA or MRI. She is asymptomatic, no recent TIA symptoms or stroke. Also denies any exertional chest pain or unusual shortness of breath. She is on aspirin and has been on atorvastatin since December with lipids outlined below.  I personally reviewed her ECG from today which showed sinus bradycardia with increased voltage and R' in lead V1. Probable repolarization abnormalities.  Past Medical History:  Diagnosis Date  . Carotid artery occlusion    Status post left CEA 2007 - Dr. Donnetta Hutching  . Hyperlipidemia   . Hypertension   . Obesity     Past Surgical History:  Procedure Laterality Date  . ABDOMINAL HYSTERECTOMY     Partial  . CAROTID ENDARTERECTOMY Left 2007  . DIAGNOSTIC MAMMOGRAM  Nov. 26,2013    Current Outpatient Prescriptions  Medication Sig Dispense Refill  . aspirin 325 MG tablet Take 325 mg by mouth daily.    Marland Kitchen atorvastatin (LIPITOR) 20 MG tablet Take 20 mg by mouth daily.    . Coenzyme Q10 (CO Q 10) 100 MG CAPS Take 100 mg by mouth daily.     No current facility-administered medications for this visit.    Allergies:  Patient has no known allergies.   Social History: The patient  reports that she quit smoking about 4 years ago. Her smoking  use included Cigarettes. She has a 1.50 pack-year smoking history. She has never used smokeless tobacco. She reports that she does not drink alcohol or use drugs.   Family History: The patient's family history includes Diabetes in her sister and sister; Hypertension in her mother; Stroke in her father and mother.   ROS:  Please see the history of present illness. Otherwise, complete review of systems is positive for none.  All other systems are reviewed and negative.   Physical Exam: VS:  BP (!) 154/68 (BP Location: Right Arm)   Pulse 60   Ht 5\' 2"  (1.575 m)   Wt 166 lb (75.3 kg)   SpO2 95%   BMI 30.36 kg/m , BMI Body mass index is 30.36 kg/m.  Wt Readings from Last 3 Encounters:  03/12/17 166 lb (75.3 kg)  02/01/14 164 lb (74.4 kg)  02/01/13 169 lb 11.2 oz (77 kg)    General: Overweight woman, appears comfortable at rest. HEENT: Conjunctiva and lids normal, oropharynx clear. Neck: Supple, no elevated JVP, prominent bilateral carotid bruits right greater than left, no thyromegaly. Lungs: Clear to auscultation, nonlabored breathing at rest. Cardiac: Regular rate and rhythm, no S3, soft systolic murmur, no pericardial rub. Abdomen: Soft, nontender, bowel sounds present, no guarding or rebound. Extremities: No pitting edema, distal pulses 2+. Skin: Warm and dry. Musculoskeletal: No kyphosis. Neuropsychiatric: Alert and oriented x3, affect grossly appropriate.  ECG: No prior tracing available for comparison.  Recent Labwork:  August 2016:  Cholesterol 272, HDL 34, LDL 173, triglycerides 325  Other Studies Reviewed Today:  Carotid Dopplers 02/02/2017: IMPRESSION: Atherosclerotic disease in bilateral carotid arteries. Prior left carotid endarterectomy.  Markedly elevated peak systolic velocities in the right carotid bulb and proximal right internal carotid artery. Estimated degree of stenosis in the right internal carotid artery is greater than 70%.  Markedly elevated peak  systolic velocity in the left carotid bulb. There is irregular plaque in this area but it does not correspond with the markedly abnormal velocity measurements. Cannot exclude web-like plaque in this area but suspect the velocity measurements are an overestimation of the stenosis at the left carotid bulb and left internal carotid artery. Recommend further characterization with a CTA or MRA to better characterize the carotid stenosis because the velocity measurements suggest a higher degree of stenosis than the grayscale and color Doppler images.  Assessment and Plan:  1. Bilateral carotid artery disease, greater than 70% RICA stenosis and equivocal assessment of the LICA but associated with increased velocities. She underwent left CEA with Dr. Donnetta Hutching back in 2007, has not had regular follow-up since 2015. Recommending that she continue aspirin and atorvastatin, we will get her set up to return to [facility] for regular follow-up and additional carotid imaging studies. No additional cardiac testing is planned at this time.  2. Hyperlipidemia, on atorvastatin since December 2017. She is followed by Dr. Gerarda Fraction.  3. Essential hypertension by history, blood pressure mildly elevated today. She is not on any antihypertensives at this point. Recommending that she keep close follow-up with Dr. Gerarda Fraction.  4. Tobacco abuse in remission.  Current medicines were reviewed with the patient today.   Orders Placed This Encounter  Procedures  . Ambulatory referral to Vascular Surgery  . EKG 12-Lead    Disposition: Follow-up as needed.  Signed, Satira Sark, MD, [facility] 03/12/2017 10:40 AM    [place] [facility] [address], [place], [place] [address] Phone: [phone]; Fax: [phone]

## 2017-03-12 ENCOUNTER — Other Ambulatory Visit: Payer: Self-pay

## 2017-03-12 ENCOUNTER — Ambulatory Visit (INDEPENDENT_AMBULATORY_CARE_PROVIDER_SITE_OTHER): Payer: PPO | Admitting: Cardiology

## 2017-03-12 ENCOUNTER — Encounter: Payer: Self-pay | Admitting: Cardiology

## 2017-03-12 VITALS — BP 154/68 | HR 60 | Ht 62.0 in | Wt 166.0 lb

## 2017-03-12 DIAGNOSIS — I779 Disorder of arteries and arterioles, unspecified: Secondary | ICD-10-CM

## 2017-03-12 DIAGNOSIS — F17201 Nicotine dependence, unspecified, in remission: Secondary | ICD-10-CM | POA: Diagnosis not present

## 2017-03-12 DIAGNOSIS — I6523 Occlusion and stenosis of bilateral carotid arteries: Secondary | ICD-10-CM

## 2017-03-12 DIAGNOSIS — I10 Essential (primary) hypertension: Secondary | ICD-10-CM | POA: Diagnosis not present

## 2017-03-12 DIAGNOSIS — E782 Mixed hyperlipidemia: Secondary | ICD-10-CM | POA: Diagnosis not present

## 2017-03-12 DIAGNOSIS — I739 Peripheral vascular disease, unspecified: Principal | ICD-10-CM

## 2017-03-12 NOTE — Patient Instructions (Signed)
Your physician recommends that you schedule a follow-up appointment in: as needed     Your physician recommends that you continue on your current medications as directed. Please refer to the Current Medication list given to you today.   You have been referred to [facility], to follow up with Dr Donnetta Hutching      Thank you for choosing [place] !

## 2017-04-30 ENCOUNTER — Other Ambulatory Visit (HOSPITAL_COMMUNITY): Payer: Self-pay | Admitting: Internal Medicine

## 2017-04-30 DIAGNOSIS — Z1231 Encounter for screening mammogram for malignant neoplasm of breast: Secondary | ICD-10-CM

## 2017-05-05 ENCOUNTER — Encounter: Payer: PPO | Admitting: Vascular Surgery

## 2017-05-05 ENCOUNTER — Ambulatory Visit (HOSPITAL_COMMUNITY): Payer: PPO

## 2017-05-11 ENCOUNTER — Ambulatory Visit (HOSPITAL_COMMUNITY)
Admission: RE | Admit: 2017-05-11 | Discharge: 2017-05-11 | Disposition: A | Discharge status: Home / Self Care | Payer: PPO | Source: Ambulatory Visit | Attending: Internal Medicine | Admitting: Internal Medicine

## 2017-05-11 DIAGNOSIS — Z1231 Encounter for screening mammogram for malignant neoplasm of breast: Secondary | ICD-10-CM | POA: Insufficient documentation

## 2017-06-30 ENCOUNTER — Encounter (HOSPITAL_COMMUNITY): Payer: PPO

## 2017-06-30 ENCOUNTER — Encounter: Payer: PPO | Admitting: Vascular Surgery

## 2017-07-21 ENCOUNTER — Encounter: Payer: Self-pay | Admitting: Vascular Surgery

## 2017-08-05 ENCOUNTER — Encounter: Payer: Self-pay | Admitting: Vascular Surgery

## 2017-08-05 ENCOUNTER — Ambulatory Visit (INDEPENDENT_AMBULATORY_CARE_PROVIDER_SITE_OTHER): Payer: PPO | Admitting: Vascular Surgery

## 2017-08-05 ENCOUNTER — Ambulatory Visit (HOSPITAL_COMMUNITY)
Admission: RE | Admit: 2017-08-05 | Discharge: 2017-08-05 | Disposition: A | Discharge status: Home / Self Care | Payer: PPO | Source: Ambulatory Visit | Attending: Vascular Surgery | Admitting: Vascular Surgery

## 2017-08-05 VITALS — BP 169/70 | HR 56 | Temp 98.4°F | Resp 18 | Ht 62.0 in | Wt 166.1 lb

## 2017-08-05 DIAGNOSIS — I6521 Occlusion and stenosis of right carotid artery: Secondary | ICD-10-CM | POA: Diagnosis not present

## 2017-08-05 DIAGNOSIS — I6523 Occlusion and stenosis of bilateral carotid arteries: Secondary | ICD-10-CM | POA: Diagnosis not present

## 2017-08-05 LAB — VAS US CAROTID
RCCAPDIAS: 23 cm/s
RCCAPSYS: 131 cm/s
RIGHT CCA MID DIAS: -28 cm/s
RIGHT ECA DIAS: -24 cm/s
RIGHT VERTEBRAL DIAS: -32 cm/s
Right cca dist sys: -83 cm/s

## 2017-08-06 NOTE — Progress Notes (Signed)
Vascular and Vein Specialist of [place]  Patient name: Taylor Banks MRN: [id number] DOB: 06-14-1946 Sex: female  REASON FOR VISIT: Discuss severe right carotid stenosis, asymptomatic  HPI: Taylor HALSETH is a 71 y.o. female here today for discussion of recent duplex revealing high-grade right carotid stenosis. Forced issues remained asymptomatic. She is well-known to me from prior left carotid endarterectomy in 2007. Did well with that procedure with no complications. She denies any cardiac disease. Unfortunate she continues to smoke. She reports that she has tried to smoke on many occasions and has a significant weight gain and therefore has a continued to smoke cigarettes. I again discussed the critical importance of stopping all cigarette smoking and tobacco products.  Past Medical History:  Diagnosis Date  . Carotid artery occlusion    Status post left CEA 2007 - Dr. Donnetta Hutching  . Hyperlipidemia   . Hypertension   . Obesity     Family History  Problem Relation Age of Onset  . Stroke Mother   . Hypertension Mother   . Stroke Father   . Diabetes Sister   . Diabetes Sister   . Heart disease Brother   . Heart disease Son     SOCIAL HISTORY: Social History  Substance Use Topics  . Smoking status: Current Every Day Smoker    Packs/day: 1.00    Years: 3.00    Types: Cigarettes    Last attempt to quit: 12/29/2012  . Smokeless tobacco: Never Used  . Alcohol use No    No Known Allergies  Current Outpatient Prescriptions  Medication Sig Dispense Refill  . aspirin 325 MG tablet Take 325 mg by mouth daily.    Marland Kitchen atorvastatin (LIPITOR) 20 MG tablet Take 20 mg by mouth daily.    . Coenzyme Q10 (CO Q 10) 100 MG CAPS Take 100 mg by mouth daily.     No current facility-administered medications for this visit.     REVIEW OF SYSTEMS:  [X]  denotes positive finding, [ ]  denotes negative finding Cardiac  Comments:  Chest pain or chest  pressure:    Shortness of breath upon exertion: x   Short of breath when lying flat:    Irregular heart rhythm:        Vascular    Pain in calf, thigh, or hip brought on by ambulation:    Pain in feet at night that wakes you up from your sleep:     Blood clot in your veins:    Leg swelling:           PHYSICAL EXAM: Vitals:   08/05/17 1236 08/05/17 1237  BP: (!) 158/82 (!) 169/70  Pulse: (!) 56   Resp: 18   Temp: 98.4 F (36.9 C)   TempSrc: Oral   SpO2: 93%   Weight: 166 lb 1.6 oz (75.3 kg)   Height: 5\' 2"  (1.575 m)     GENERAL: The patient is a well-nourished female, in no acute distress. The vital signs are documented above. CARDIOVASCULAR: Radial pulses are 2+ bilaterally. She has a well-healed left carotid incision. I do not hear carotid bruits. PULMONARY: There is good air exchange  MUSCULOSKELETAL: There are no major deformities or cyanosis. NEUROLOGIC: No focal weakness or paresthesias are detected. SKIN: There are no ulcers or rashes noted. PSYCHIATRIC: The patient has a normal affect.  DATA:  Repeat duplex confirmed high-grade right carotid stenosis located at the bifurcation with a normal internal carotid distally this.  MEDICAL ISSUES: Discussed the  significance of this with patient. Have recommended right carotid endarterectomy for reduction of stroke risk. She understands she would be admission the day of surgery and that have an expected one night hospitalization. Did discuss potential risk to include the 1-1-1/2% risk of stroke with the procedure itself. We will schedule surgery at her earliest [place]. England Greb, MD [facility] Vascular and Vein Specialists of [facility] Tel [phone] Pager [phone]

## 2017-08-27 ENCOUNTER — Other Ambulatory Visit: Payer: Self-pay

## 2017-09-02 NOTE — Pre-Procedure Instructions (Signed)
ASHBY LEFLORE  09/02/2017      [facility] - [place], [place] [address] [place] [address] Phone: [phone] Fax: [phone]    Your procedure is scheduled on September 12  Report to [facility] Admitting at 0900 A.M.  Call this number if you have problems the morning of surgery:  [phone]   Remember:  Do not eat food or drink liquids after midnight.  Continue all other medications as directed by your physician except follow these medication instructions before surgery   Take these medicines the morning of surgery with A SIP OF WATER NONE  7 days prior to surgery STOP taking any Aleve, Naproxen, Ibuprofen, Motrin, Advil, Goody's, BC's, all herbal medications, fish oil, and all vitamins  Follow your doctors instructions regarding your Aspirin.  If no instructions were given by the doctor you will need to call the office to get instructions.  Your pre admission RN will also call for those instructions     Do not wear jewelry, make-up or nail polish.  Do not wear lotions, powders, or perfumes, or deoderant.  Do not shave 48 hours prior to surgery.  Men may shave face and neck.  Do not bring valuables to the hospital.  [facility] is not responsible for any belongings or valuables.  Contacts, dentures or bridgework may not be worn into surgery.  Leave your suitcase in the car.  After surgery it may be brought to your room.  For patients admitted to the hospital, discharge time will be determined by your treatment team.  Patients discharged the day of surgery will not be allowed to drive home.    Special instructions:   [place]- Preparing For Surgery  Before surgery, you can play an important role. Because skin is not sterile, your skin needs to be as free of germs as possible. You can reduce the number of germs on your skin by washing with CHG (chlorahexidine gluconate) Soap before surgery.  CHG is  an antiseptic cleaner which kills germs and bonds with the skin to continue killing germs even after washing.  Please do not use if you have an allergy to CHG or antibacterial soaps. If your skin becomes reddened/irritated stop using the CHG.  Do not shave (including legs and underarms) for at least 48 hours prior to first CHG shower. It is OK to shave your face.  Please follow these instructions carefully.   1. Shower the NIGHT BEFORE SURGERY and the MORNING OF SURGERY with CHG.   2. If you chose to wash your hair, wash your hair first as usual with your normal shampoo.  3. After you shampoo, rinse your hair and body thoroughly to remove the shampoo.  4. Use CHG as you would any other liquid soap. You can apply CHG directly to the skin and wash gently with a scrungie or a clean washcloth.   5. Apply the CHG Soap to your body ONLY FROM THE NECK DOWN.  Do not use on open wounds or open sores. Avoid contact with your eyes, ears, mouth and genitals (private parts). Wash genitals (private parts) with your normal soap.  6. Wash thoroughly, paying special attention to the area where your surgery will be performed.  7. Thoroughly rinse your body with warm water from the neck down.  8. DO NOT shower/wash with your normal soap after using and rinsing off the CHG Soap.  9. Pat yourself dry with a CLEAN TOWEL.  10. Wear CLEAN PAJAMAS   11. Place CLEAN SHEETS on your bed the night of your first shower and DO NOT SLEEP WITH PETS.    Day of Surgery: Do not apply any deodorants/lotions. Please wear clean clothes to the hospital/surgery center.      Please read over the following fact sheets that you were given.

## 2017-09-03 ENCOUNTER — Encounter (HOSPITAL_COMMUNITY)
Admission: RE | Admit: 2017-09-03 | Discharge: 2017-09-03 | Disposition: A | Discharge status: Home / Self Care | Payer: PPO | Source: Ambulatory Visit | Attending: Vascular Surgery | Admitting: Vascular Surgery

## 2017-09-03 ENCOUNTER — Encounter (HOSPITAL_COMMUNITY): Payer: Self-pay

## 2017-09-03 DIAGNOSIS — Z01812 Encounter for preprocedural laboratory examination: Secondary | ICD-10-CM | POA: Diagnosis not present

## 2017-09-03 HISTORY — DX: Gastro-esophageal reflux disease without esophagitis: K21.9

## 2017-09-03 HISTORY — DX: Malignant (primary) neoplasm, unspecified: C80.1

## 2017-09-03 LAB — CBC
HEMATOCRIT: 41.5 % (ref 36.0–46.0)
Hemoglobin: 14.1 g/dL (ref 12.0–15.0)
MCH: 31.7 pg (ref 26.0–34.0)
MCHC: 34 g/dL (ref 30.0–36.0)
MCV: 93.3 fL (ref 78.0–100.0)
PLATELETS: 211 10*3/uL (ref 150–400)
RBC: 4.45 MIL/uL (ref 3.87–5.11)
RDW: 12.5 % (ref 11.5–15.5)
WBC: 7 10*3/uL (ref 4.0–10.5)

## 2017-09-03 LAB — URINALYSIS, ROUTINE W REFLEX MICROSCOPIC
BILIRUBIN URINE: NEGATIVE
Glucose, UA: NEGATIVE mg/dL
Hgb urine dipstick: NEGATIVE
KETONES UR: NEGATIVE mg/dL
LEUKOCYTES UA: NEGATIVE
NITRITE: NEGATIVE
PH: 5 (ref 5.0–8.0)
PROTEIN: NEGATIVE mg/dL
Specific Gravity, Urine: 1.018 (ref 1.005–1.030)

## 2017-09-03 LAB — COMPREHENSIVE METABOLIC PANEL
ALBUMIN: 4 g/dL (ref 3.5–5.0)
ALT: 29 U/L (ref 14–54)
AST: 25 U/L (ref 15–41)
Alkaline Phosphatase: 68 U/L (ref 38–126)
Anion gap: 8 (ref 5–15)
BUN: 14 mg/dL (ref 6–20)
CHLORIDE: 105 mmol/L (ref 101–111)
CO2: 26 mmol/L (ref 22–32)
CREATININE: 0.94 mg/dL (ref 0.44–1.00)
Calcium: 9.1 mg/dL (ref 8.9–10.3)
GFR calc Af Amer: >60 mL/min (ref >60)
GFR, EST NON AFRICAN AMERICAN: 60 mL/min — AB (ref >60)
GLUCOSE: 137 mg/dL — AB (ref 65–99)
Potassium: 4.3 mmol/L (ref 3.5–5.1)
Sodium: 139 mmol/L (ref 135–145)
Total Bilirubin: 0.5 mg/dL (ref 0.3–1.2)
Total Protein: 6.4 g/dL — ABNORMAL LOW (ref 6.5–8.1)

## 2017-09-03 LAB — TYPE AND SCREEN
ABO/RH(D): A POS
ANTIBODY SCREEN: NEGATIVE

## 2017-09-03 LAB — PROTIME-INR
INR: 0.98
Prothrombin Time: 12.9 seconds (ref 11.4–15.2)

## 2017-09-03 LAB — SURGICAL PCR SCREEN
MRSA, PCR: NEGATIVE
Staphylococcus aureus: NEGATIVE

## 2017-09-03 LAB — APTT: aPTT: 39 seconds — ABNORMAL HIGH (ref 24–36)

## 2017-09-03 NOTE — Progress Notes (Signed)
PCP is Dr. Redmond School  [place] 12/2016 Did See Dr. Myles Gip in 02/2017 after finding current stenosis.  Denies murmur, sob, cp.

## 2017-09-09 ENCOUNTER — Inpatient Hospital Stay (HOSPITAL_COMMUNITY)
Admission: RE | Admit: 2017-09-09 | Discharge: 2017-09-10 | DRG: 039 | Disposition: A | Discharge status: Home / Self Care | Payer: PPO | Source: Ambulatory Visit | Attending: Vascular Surgery | Admitting: Vascular Surgery

## 2017-09-09 ENCOUNTER — Encounter (HOSPITAL_COMMUNITY)
Admission: RE | Disposition: A | Discharge status: Home / Self Care | Payer: Self-pay | Source: Ambulatory Visit | Attending: Vascular Surgery

## 2017-09-09 ENCOUNTER — Inpatient Hospital Stay (HOSPITAL_COMMUNITY): Payer: PPO | Admitting: Critical Care Medicine

## 2017-09-09 ENCOUNTER — Encounter (HOSPITAL_COMMUNITY): Payer: Self-pay | Admitting: *Deleted

## 2017-09-09 DIAGNOSIS — F17210 Nicotine dependence, cigarettes, uncomplicated: Secondary | ICD-10-CM | POA: Diagnosis not present

## 2017-09-09 DIAGNOSIS — I6521 Occlusion and stenosis of right carotid artery: Principal | ICD-10-CM | POA: Diagnosis present

## 2017-09-09 DIAGNOSIS — K219 Gastro-esophageal reflux disease without esophagitis: Secondary | ICD-10-CM | POA: Diagnosis present

## 2017-09-09 DIAGNOSIS — E669 Obesity, unspecified: Secondary | ICD-10-CM | POA: Diagnosis present

## 2017-09-09 DIAGNOSIS — Z8541 Personal history of malignant neoplasm of cervix uteri: Secondary | ICD-10-CM

## 2017-09-09 DIAGNOSIS — E785 Hyperlipidemia, unspecified: Secondary | ICD-10-CM | POA: Diagnosis not present

## 2017-09-09 DIAGNOSIS — Z7982 Long term (current) use of aspirin: Secondary | ICD-10-CM | POA: Diagnosis not present

## 2017-09-09 DIAGNOSIS — Z6831 Body mass index (BMI) 31.0-31.9, adult: Secondary | ICD-10-CM

## 2017-09-09 DIAGNOSIS — I10 Essential (primary) hypertension: Secondary | ICD-10-CM | POA: Diagnosis not present

## 2017-09-09 DIAGNOSIS — Z79899 Other long term (current) drug therapy: Secondary | ICD-10-CM | POA: Diagnosis not present

## 2017-09-09 DIAGNOSIS — I739 Peripheral vascular disease, unspecified: Secondary | ICD-10-CM | POA: Diagnosis not present

## 2017-09-09 HISTORY — PX: ENDARTERECTOMY: SHX5162

## 2017-09-09 HISTORY — PX: PATCH ANGIOPLASTY: SHX6230

## 2017-09-09 SURGERY — ENDARTERECTOMY, CAROTID
Anesthesia: General | Site: Neck | Laterality: Right

## 2017-09-09 MED ORDER — ONDANSETRON HCL 4 MG/2ML IJ SOLN
INTRAMUSCULAR | Status: AC
  Filled 2017-09-09: qty 4

## 2017-09-09 MED ORDER — PHENYLEPHRINE HCL 10 MG/ML IJ SOLN
INTRAVENOUS | Status: DC | PRN
  Administered 2017-09-09: 15 ug/min via INTRAVENOUS

## 2017-09-09 MED ORDER — FENTANYL CITRATE (PF) 250 MCG/5ML IJ SOLN
INTRAMUSCULAR | Status: DC | PRN
  Administered 2017-09-09 (×2): 50 ug via INTRAVENOUS

## 2017-09-09 MED ORDER — ASPIRIN 325 MG PO TABS
325.0000 mg | ORAL_TABLET | Freq: Every day | ORAL | Status: DC
  Administered 2017-09-09: 325 mg via ORAL
  Filled 2017-09-09: qty 1

## 2017-09-09 MED ORDER — ROCURONIUM BROMIDE 10 MG/ML (PF) SYRINGE
PREFILLED_SYRINGE | INTRAVENOUS | Status: AC
  Filled 2017-09-09: qty 5

## 2017-09-09 MED ORDER — HYDRALAZINE HCL 20 MG/ML IJ SOLN: 5.0000 mg | INTRAMUSCULAR | Status: DC | PRN

## 2017-09-09 MED ORDER — BISACODYL 10 MG RE SUPP: 10.0000 mg | Freq: Every day | RECTAL | Status: DC | PRN

## 2017-09-09 MED ORDER — SODIUM CHLORIDE 0.9 % IV SOLN: 500.0000 mL | Freq: Once | INTRAVENOUS | Status: DC | PRN

## 2017-09-09 MED ORDER — PHENOL 1.4 % MT LIQD: 1.0000 | OROMUCOSAL | Status: DC | PRN

## 2017-09-09 MED ORDER — ROCURONIUM BROMIDE 100 MG/10ML IV SOLN
INTRAVENOUS | Status: DC | PRN
  Administered 2017-09-09: 60 mg via INTRAVENOUS

## 2017-09-09 MED ORDER — DEXAMETHASONE SODIUM PHOSPHATE 10 MG/ML IJ SOLN
INTRAMUSCULAR | Status: DC | PRN
  Administered 2017-09-09: 10 mg via INTRAVENOUS

## 2017-09-09 MED ORDER — ACETAMINOPHEN 325 MG PO TABS: 325.0000 mg | ORAL_TABLET | ORAL | Status: DC | PRN

## 2017-09-09 MED ORDER — SUGAMMADEX SODIUM 200 MG/2ML IV SOLN
INTRAVENOUS | Status: AC
  Filled 2017-09-09: qty 2

## 2017-09-09 MED ORDER — PROPOFOL 10 MG/ML IV BOLUS
INTRAVENOUS | Status: AC
  Filled 2017-09-09: qty 20

## 2017-09-09 MED ORDER — ONDANSETRON HCL 4 MG/2ML IJ SOLN: 4.0000 mg | Freq: Four times a day (QID) | INTRAMUSCULAR | Status: DC | PRN

## 2017-09-09 MED ORDER — FENTANYL CITRATE (PF) 100 MCG/2ML IJ SOLN
25.0000 ug | INTRAMUSCULAR | Status: DC | PRN
  Administered 2017-09-09 (×4): 25 ug via INTRAVENOUS

## 2017-09-09 MED ORDER — DEXTROSE 5 % IV SOLN
1.5000 g | Freq: Two times a day (BID) | INTRAVENOUS | Status: DC
  Administered 2017-09-09: 1.5 g via INTRAVENOUS
  Filled 2017-09-09 (×2): qty 1.5

## 2017-09-09 MED ORDER — SUGAMMADEX SODIUM 200 MG/2ML IV SOLN
INTRAVENOUS | Status: DC | PRN
  Administered 2017-09-09: 160 mg via INTRAVENOUS

## 2017-09-09 MED ORDER — GLYCOPYRROLATE 0.2 MG/ML IJ SOLN
INTRAMUSCULAR | Status: DC | PRN
  Administered 2017-09-09: 0.2 mg via INTRAVENOUS

## 2017-09-09 MED ORDER — PROTAMINE SULFATE 10 MG/ML IV SOLN
INTRAVENOUS | Status: DC | PRN
  Administered 2017-09-09: 50 mg via INTRAVENOUS

## 2017-09-09 MED ORDER — SODIUM CHLORIDE 0.9 % IV SOLN
0.0125 ug/kg/min | Freq: Once | INTRAVENOUS | Status: AC
  Administered 2017-09-09: .2 ug/kg/min via INTRAVENOUS
  Filled 2017-09-09: qty 2000

## 2017-09-09 MED ORDER — CHLORHEXIDINE GLUCONATE 4 % EX LIQD: 60.0000 mL | Freq: Once | CUTANEOUS | Status: DC

## 2017-09-09 MED ORDER — PROPOFOL 10 MG/ML IV BOLUS
INTRAVENOUS | Status: DC | PRN
  Administered 2017-09-09: 50 mg via INTRAVENOUS
  Administered 2017-09-09: 40 mg via INTRAVENOUS
  Administered 2017-09-09: 20 mg via INTRAVENOUS

## 2017-09-09 MED ORDER — LIDOCAINE 2% (20 MG/ML) 5 ML SYRINGE
INTRAMUSCULAR | Status: AC
  Filled 2017-09-09: qty 5

## 2017-09-09 MED ORDER — PHENYLEPHRINE HCL 10 MG/ML IJ SOLN
INTRAMUSCULAR | Status: DC | PRN
  Administered 2017-09-09: 80 ug via INTRAVENOUS

## 2017-09-09 MED ORDER — DEXTROSE 5 % IV SOLN
1.5000 g | INTRAVENOUS | Status: AC
  Administered 2017-09-09: 1.5 g via INTRAVENOUS

## 2017-09-09 MED ORDER — LIDOCAINE HCL (PF) 1 % IJ SOLN
INTRAMUSCULAR | Status: AC
  Filled 2017-09-09: qty 30

## 2017-09-09 MED ORDER — MAGNESIUM SULFATE 2 GM/50ML IV SOLN
2.0000 g | Freq: Every day | INTRAVENOUS | Status: DC | PRN
  Filled 2017-09-09: qty 50

## 2017-09-09 MED ORDER — LACTATED RINGERS IV SOLN
Freq: Once | INTRAVENOUS | Status: AC
  Administered 2017-09-09: 09:00:00 via INTRAVENOUS

## 2017-09-09 MED ORDER — SODIUM CHLORIDE 0.9 % IV SOLN: INTRAVENOUS | Status: DC

## 2017-09-09 MED ORDER — PROTAMINE SULFATE 10 MG/ML IV SOLN
INTRAVENOUS | Status: AC
  Filled 2017-09-09: qty 5

## 2017-09-09 MED ORDER — PANTOPRAZOLE SODIUM 40 MG PO TBEC: 40.0000 mg | DELAYED_RELEASE_TABLET | Freq: Every day | ORAL | Status: DC

## 2017-09-09 MED ORDER — 0.9 % SODIUM CHLORIDE (POUR BTL) OPTIME
TOPICAL | Status: DC | PRN
  Administered 2017-09-09: 2000 mL

## 2017-09-09 MED ORDER — DOCUSATE SODIUM 100 MG PO CAPS: 100.0000 mg | ORAL_CAPSULE | Freq: Every day | ORAL | Status: DC

## 2017-09-09 MED ORDER — LABETALOL HCL 5 MG/ML IV SOLN: 10.0000 mg | INTRAVENOUS | Status: DC | PRN

## 2017-09-09 MED ORDER — ONDANSETRON HCL 4 MG/2ML IJ SOLN
INTRAMUSCULAR | Status: DC | PRN
  Administered 2017-09-09: 4 mg via INTRAVENOUS

## 2017-09-09 MED ORDER — HEPARIN SODIUM (PORCINE) 1000 UNIT/ML IJ SOLN
INTRAMUSCULAR | Status: DC | PRN
  Administered 2017-09-09: 8000 [IU] via INTRAVENOUS

## 2017-09-09 MED ORDER — MORPHINE SULFATE (PF) 2 MG/ML IV SOLN: 2.0000 mg | INTRAVENOUS | Status: DC | PRN

## 2017-09-09 MED ORDER — FENTANYL CITRATE (PF) 100 MCG/2ML IJ SOLN
INTRAMUSCULAR | Status: AC
  Filled 2017-09-09: qty 2

## 2017-09-09 MED ORDER — OXYCODONE HCL 5 MG/5ML PO SOLN: 5.0000 mg | Freq: Once | ORAL | Status: DC | PRN

## 2017-09-09 MED ORDER — CO Q 10 100 MG PO CAPS: 100.0000 mg | ORAL_CAPSULE | Freq: Every day | ORAL | Status: DC

## 2017-09-09 MED ORDER — CEFUROXIME SODIUM 1.5 G IV SOLR
INTRAVENOUS | Status: AC
  Filled 2017-09-09: qty 1.5

## 2017-09-09 MED ORDER — DEXAMETHASONE SODIUM PHOSPHATE 10 MG/ML IJ SOLN
INTRAMUSCULAR | Status: AC
  Filled 2017-09-09: qty 2

## 2017-09-09 MED ORDER — FENTANYL CITRATE (PF) 250 MCG/5ML IJ SOLN
INTRAMUSCULAR | Status: AC
  Filled 2017-09-09: qty 5

## 2017-09-09 MED ORDER — GUAIFENESIN-DM 100-10 MG/5ML PO SYRP: 15.0000 mL | ORAL_SOLUTION | ORAL | Status: DC | PRN

## 2017-09-09 MED ORDER — LACTATED RINGERS IV SOLN
INTRAVENOUS | Status: DC | PRN
  Administered 2017-09-09 (×2): via INTRAVENOUS

## 2017-09-09 MED ORDER — OXYCODONE HCL 5 MG PO TABS: 5.0000 mg | ORAL_TABLET | Freq: Once | ORAL | Status: DC | PRN

## 2017-09-09 MED ORDER — ALUM & MAG HYDROXIDE-SIMETH 200-200-20 MG/5ML PO SUSP: 15.0000 mL | ORAL | Status: DC | PRN

## 2017-09-09 MED ORDER — POTASSIUM CHLORIDE CRYS ER 20 MEQ PO TBCR: 20.0000 meq | EXTENDED_RELEASE_TABLET | Freq: Every day | ORAL | Status: DC | PRN

## 2017-09-09 MED ORDER — METOPROLOL TARTRATE 5 MG/5ML IV SOLN: 2.0000 mg | INTRAVENOUS | Status: DC | PRN

## 2017-09-09 MED ORDER — SODIUM CHLORIDE 0.9 % IV SOLN
INTRAVENOUS | Status: DC | PRN
  Administered 2017-09-09: 11:00:00

## 2017-09-09 MED ORDER — ACETAMINOPHEN 160 MG/5ML PO SOLN: 325.0000 mg | ORAL | Status: DC | PRN

## 2017-09-09 MED ORDER — OXYCODONE-ACETAMINOPHEN 5-325 MG PO TABS: 1.0000 | ORAL_TABLET | Freq: Four times a day (QID) | ORAL | Status: DC | PRN

## 2017-09-09 MED ORDER — EPHEDRINE SULFATE 50 MG/ML IJ SOLN
INTRAMUSCULAR | Status: DC | PRN
  Administered 2017-09-09: 10 mg via INTRAVENOUS

## 2017-09-09 MED ORDER — ACETAMINOPHEN 650 MG RE SUPP: 325.0000 mg | RECTAL | Status: DC | PRN

## 2017-09-09 MED ORDER — ATORVASTATIN CALCIUM 20 MG PO TABS
20.0000 mg | ORAL_TABLET | Freq: Every day | ORAL | Status: DC
  Administered 2017-09-09: 20 mg via ORAL
  Filled 2017-09-09: qty 1

## 2017-09-09 MED ORDER — POLYETHYLENE GLYCOL 3350 17 G PO PACK: 17.0000 g | PACK | Freq: Every day | ORAL | Status: DC | PRN

## 2017-09-09 SURGICAL SUPPLY — 44 items
CANISTER SUCT 3000ML PPV (MISCELLANEOUS) ×2 IMPLANT
CANNULA VESSEL 3MM 2 BLNT TIP (CANNULA) ×4 IMPLANT
CATH ROBINSON RED A/P 18FR (CATHETERS) ×2 IMPLANT
CLIP LIGATING EXTRA MED SLVR (CLIP) ×2 IMPLANT
CLIP LIGATING EXTRA SM BLUE (MISCELLANEOUS) ×2 IMPLANT
CRADLE DONUT ADULT HEAD (MISCELLANEOUS) ×2 IMPLANT
DECANTER SPIKE VIAL GLASS SM (MISCELLANEOUS) IMPLANT
DERMABOND ADVANCED (GAUZE/BANDAGES/DRESSINGS) ×1
DERMABOND ADVANCED .7 DNX12 (GAUZE/BANDAGES/DRESSINGS) ×1 IMPLANT
DRAIN HEMOVAC 1/8 X 5 (WOUND CARE) IMPLANT
ELECT REM PT RETURN 9FT ADLT (ELECTROSURGICAL) ×2
ELECTRODE REM PT RTRN 9FT ADLT (ELECTROSURGICAL) ×1 IMPLANT
EVACUATOR SILICONE 100CC (DRAIN) IMPLANT
GLOVE BIO SURGEON STRL SZ 6.5 (GLOVE) ×8 IMPLANT
GLOVE BIOGEL PI IND STRL 6.5 (GLOVE) ×1 IMPLANT
GLOVE BIOGEL PI IND STRL 7.0 (GLOVE) ×1 IMPLANT
GLOVE BIOGEL PI INDICATOR 6.5 (GLOVE) ×1
GLOVE BIOGEL PI INDICATOR 7.0 (GLOVE) ×1
GLOVE ECLIPSE 7.5 STRL STRAW (GLOVE) ×2 IMPLANT
GLOVE SS BIOGEL STRL SZ 7.5 (GLOVE) ×1 IMPLANT
GLOVE SUPERSENSE BIOGEL SZ 7.5 (GLOVE) ×1
GOWN STRL REUS W/ TWL LRG LVL3 (GOWN DISPOSABLE) ×3 IMPLANT
GOWN STRL REUS W/ TWL XL LVL3 (GOWN DISPOSABLE) ×1 IMPLANT
GOWN STRL REUS W/TWL LRG LVL3 (GOWN DISPOSABLE) ×3
GOWN STRL REUS W/TWL XL LVL3 (GOWN DISPOSABLE) ×1
KIT BASIN OR (CUSTOM PROCEDURE TRAY) ×2 IMPLANT
KIT ROOM TURNOVER OR (KITS) ×2 IMPLANT
KIT SHUNT ARGYLE CAROTID ART 6 (VASCULAR PRODUCTS) IMPLANT
NEEDLE 22X1 1/2 (OR ONLY) (NEEDLE) IMPLANT
NS IRRIG 1000ML POUR BTL (IV SOLUTION) ×4 IMPLANT
PACK CAROTID (CUSTOM PROCEDURE TRAY) ×2 IMPLANT
PAD ARMBOARD 7.5X6 YLW CONV (MISCELLANEOUS) ×4 IMPLANT
PATCH HEMASHIELD 8X75 (Vascular Products) ×2 IMPLANT
SHUNT CAROTID BYPASS 10 (VASCULAR PRODUCTS) ×2 IMPLANT
SHUNT CAROTID BYPASS 12FRX15.5 (VASCULAR PRODUCTS) IMPLANT
SUT ETHILON 3 0 PS 1 (SUTURE) IMPLANT
SUT PROLENE 6 0 CC (SUTURE) ×4 IMPLANT
SUT SILK 3 0 (SUTURE)
SUT SILK 3-0 18XBRD TIE 12 (SUTURE) IMPLANT
SUT VIC AB 3-0 SH 27 (SUTURE) ×2
SUT VIC AB 3-0 SH 27X BRD (SUTURE) ×2 IMPLANT
SUT VICRYL 4-0 PS2 18IN ABS (SUTURE) ×2 IMPLANT
SYR CONTROL 10ML LL (SYRINGE) IMPLANT
WATER STERILE IRR 1000ML POUR (IV SOLUTION) ×2 IMPLANT

## 2017-09-09 NOTE — Anesthesia Procedure Notes (Signed)
Procedure Name: Intubation Date/Time: 09/09/2017 10:47 AM Performed by: Lance Coon Pre-anesthesia Checklist: Patient identified, Emergency Drugs available, Suction available, Patient being monitored and Timeout performed Patient Re-evaluated:Patient Re-evaluated prior to induction Oxygen Delivery Method: Circle system utilized Preoxygenation: Pre-oxygenation with 100% oxygen Induction Type: IV induction Ventilation: Mask ventilation without difficulty Laryngoscope Size: Miller Grade View: Grade II Tube type: Oral Tube size: 7.0 mm Number of attempts: 1 Airway Equipment and Method: Stylet Placement Confirmation: ETT inserted through vocal cords under direct vision,  positive ETCO2 and breath sounds checked- equal and bilateral Secured at: 21 cm Tube secured with: Tape Dental Injury: Teeth and Oropharynx as per pre-operative assessment

## 2017-09-09 NOTE — Transfer of Care (Signed)
Immediate Anesthesia Transfer of Care Note  Patient: Taylor Banks  Procedure(s) Performed: Procedure(s): ENDARTERECTOMY CAROTID- RIGHT (Right) PATCH ANGIOPLASTY USING HEMASHIELD PLATINUM FINESSE PATCH (Right)  Patient Location: PACU  Anesthesia Type:General  Level of Consciousness: awake, alert , oriented and patient cooperative  Airway & Oxygen Therapy: Patient Spontanous Breathing  Post-op Assessment: Report given to RN and Post -op Vital signs reviewed and stable  Post vital signs: Reviewed and stable  Last Vitals:  Vitals:   09/09/17 0921 09/09/17 1248  BP: (!) 179/59   Pulse: (!) 58   Resp: 20   Temp: 36.8 C (P) 36.7 C  SpO2: 97%     Last Pain:  Vitals:   09/09/17 0921  TempSrc: Oral      Patients Stated Pain Goal: 3 (55/20/80 2233)  Complications: No apparent anesthesia complications

## 2017-09-09 NOTE — Discharge Instructions (Signed)
° °  Vascular and Vein Specialists of [place] ° °Discharge Instructions °  °Carotid Endarterectomy (CEA) ° °Please refer to the following instructions for your post-procedure care. Your surgeon or physician assistant will discuss any changes with you. ° °Activity ° °You are encouraged to walk as much as you can. You can slowly return to normal activities but must avoid strenuous activity and heavy lifting until your doctor tell you it's OK. Avoid activities such as vacuuming or swinging a golf club. You can drive after one week if you are comfortable and you are no longer taking prescription pain medications. It is normal to feel tired for serval weeks after your surgery. It is also normal to have difficulty with sleep habits, eating, and bowel movements after surgery. These will go away with time. ° °Bathing/Showering ° °You may shower after you go home. Do not soak in a bathtub, hot tub, or swim until the incision heals completely. ° °Incision Care ° °Shower every day. Clean your incision with mild soap and water. Pat the area dry with a clean towel. You do not need a bandage unless otherwise instructed. Do not apply any ointments or creams to your incision. You may have skin glue on your incision. Do not peel it off. It will come off on its own in about one week. Your incision may feel thickened and raised for several weeks after your surgery. This is normal and the skin will soften over time. For Men Only: It's OK to shave around the incision but do not shave the incision itself for 2 weeks. It is common to have numbness under your chin that could last for several months. ° °Diet ° °Resume your normal diet. There are no special food restrictions following this procedure. A low fat/low cholesterol diet is recommended for all patients with vascular disease. In order to heal from your surgery, it is CRITICAL to get adequate nutrition. Your body requires vitamins, minerals, and protein. Vegetables are the best  source of vitamins and minerals. Vegetables also provide the perfect balance of protein. Processed food has little nutritional value, so try to avoid this. ° °Medications ° °Resume taking all of your medications unless your doctor or physician assistant tells you not to. If your incision is causing pain, you may take over-the- counter pain relievers such as acetaminophen (Tylenol). If you were prescribed a stronger pain medication, please be aware these medications can cause nausea and constipation. Prevent nausea by taking the medication with a snack or meal. Avoid constipation by drinking plenty of fluids and eating foods with a high amount of fiber, such as fruits, vegetables, and grains. Do not take Tylenol if you are taking prescription pain medications. ° °Follow Up ° °Our office will schedule a follow up appointment 2-3 weeks following discharge. ° °Please call us immediately for any of the following conditions ° °Increased pain, redness, drainage (pus) from your incision site. °Fever of 101 degrees or higher. °If you should develop stroke (slurred speech, difficulty swallowing, weakness on one side of your body, loss of vision) you should call 911 and go to the nearest emergency room. ° °Reduce your risk of vascular disease: ° °Stop smoking. If you would like help call [facility] at 1-800-QUIT-NOW (1-800-784-8669) or [place] at 336-586-4000. °Manage your cholesterol °Maintain a desired weight °Control your diabetes °Keep your blood pressure down ° °If you have any questions, please call the office at [phone]. ° °

## 2017-09-09 NOTE — H&P (Signed)
Office Visit   08/05/2017 Vascular and Vein Specialists -Sharman Crate, MD  Vascular Surgery   Stenosis of right carotid artery  Dx   Carotid ; Referred by Redmond School, MD  Reason for Visit   Additional Documentation   Vitals:   BP  169/70 (BP Location: Left Arm, Patient Position: Sitting, Cuff Size: Normal)   Pulse  56   Temp 98.4 F (36.9 C) (Oral)   Resp 18   Ht 5\' 2"  (1.575 m)   Wt 166 lb 1.6 oz (75.3 kg)   SpO2 93%   BMI 30.38 kg/m   BSA 1.81 m      More Vitals   Flowsheets:   Infectious Disease Screening,   Healthcare Directives,   Clinical Intake,   Custom Formula Data,   Vital Signs,   MEWS Score,   Anthropometrics     Encounter Info:   Billing Info,   History,   Allergies,   Detailed Report     All Notes   Progress Notes by Rosetta Posner, MD at 08/05/2017 12:00 PM   Author: Rosetta Posner, MD Author Type: Physician Filed: 08/11/2017 9:33 AM  Note Status: Signed Cosign: Cosign Not Required Encounter Date: 08/05/2017  Editor: Rosetta Posner, MD (Physician)                                          Vascular and Vein Specialist of [facility]  Patient name: Taylor Banks MRN: [id number]        DOB: Dec 13, 1946          Sex: female  REASON FOR VISIT: Discuss severe right carotid stenosis, asymptomatic  HPI: Taylor Banks is a 71 y.o. female here today for discussion of recent duplex revealing high-grade right carotid stenosis. Forced issues remained asymptomatic. She is well-known to me from prior left carotid endarterectomy in 2007. Did well with that procedure with no complications. She denies any cardiac disease. Unfortunate she continues to smoke. She reports that she has tried to smoke on many occasions and has a significant weight gain and therefore has a continued to smoke cigarettes. I again discussed the critical importance of stopping all cigarette smoking and tobacco products.      Past Medical History:  Diagnosis  Date  . Carotid artery occlusion    Status post left CEA 2007 - Dr. Donnetta Hutching  . Hyperlipidemia   . Hypertension   . Obesity          Family History  Problem Relation Age of Onset  . Stroke Mother   . Hypertension Mother   . Stroke Father   . Diabetes Sister   . Diabetes Sister   . Heart disease Brother   . Heart disease Son     SOCIAL HISTORY:      Social History  Substance Use Topics  . Smoking status: Current Every Day Smoker    Packs/day: 1.00    Years: 3.00    Types: Cigarettes    Last attempt to quit: 12/29/2012  . Smokeless tobacco: Never Used  . Alcohol use No    No Known Allergies        Current Outpatient Prescriptions  Medication Sig Dispense Refill  . aspirin 325 MG tablet Take 325 mg by mouth daily.    Marland Kitchen atorvastatin (LIPITOR) 20 MG tablet Take 20 mg by mouth daily.    Marland Kitchen  Coenzyme Q10 (CO Q 10) 100 MG CAPS Take 100 mg by mouth daily.     No current facility-administered medications for this visit.     REVIEW OF SYSTEMS:  [X]  denotes positive finding, [ ]  denotes negative finding Cardiac  Comments:  Chest pain or chest pressure:    Shortness of breath upon exertion: x   Short of breath when lying flat:    Irregular heart rhythm:        Vascular    Pain in calf, thigh, or hip brought on by ambulation:    Pain in feet at night that wakes you up from your sleep:     Blood clot in your veins:    Leg swelling:           PHYSICAL EXAM:     Vitals:   08/05/17 1236 08/05/17 1237  BP: (!) 158/82 (!) 169/70  Pulse: (!) 56   Resp: 18   Temp: 98.4 F (36.9 C)   TempSrc: Oral   SpO2: 93%   Weight: 166 lb 1.6 oz (75.3 kg)   Height: 5\' 2"  (1.575 m)     GENERAL: The patient is a well-nourished female, in no acute distress. The vital signs are documented above. CARDIOVASCULAR: Radial pulses are 2+ bilaterally. She has a well-healed left carotid incision. I do not hear carotid  bruits. PULMONARY: There is good air exchange  MUSCULOSKELETAL: There are no major deformities or cyanosis. NEUROLOGIC: No focal weakness or paresthesias are detected. SKIN: There are no ulcers or rashes noted. PSYCHIATRIC: The patient has a normal affect.  DATA:  Repeat duplex confirmed high-grade right carotid stenosis located at the bifurcation with a normal internal carotid distally this.  MEDICAL ISSUES: Discussed the significance of this with patient. Have recommended right carotid endarterectomy for reduction of stroke risk. She understands she would be admission the day of surgery and that have an expected one night hospitalization. Did discuss potential risk to include the 1-1-1/2% risk of stroke with the procedure itself. We will schedule surgery at her earliest [place]. Devon Pretty, MD [facility] Vascular and Vein Specialists of [facility] [phone] Pager [phone]     Communications   Addendum:  The patient has been re-examined and re-evaluated.  The patient's history and physical has been reviewed and is unchanged.    Taylor Banks is a 71 y.o. female is being admitted with Right Internal Carotid Artery Stenosis  I65.21 . All the risks, benefits and other treatment options have been discussed with the patient. The patient has consented to proceed with Procedure(s): ENDARTERECTOMY CAROTID- RIGHT as a surgical intervention.  Curt Jews 09/09/2017 9:08 AM Vascular and Vein Surgery

## 2017-09-09 NOTE — Anesthesia Procedure Notes (Signed)
Arterial Line Insertion Start/End9/11/2017 9:45 AM, 09/09/2017 9:51 AM Performed by: Wilburn Cornelia, CRNA  Patient location: Pre-op. Preanesthetic checklist: patient identified, IV checked, risks and benefits discussed, surgical consent, monitors and equipment checked and pre-op evaluation Lidocaine 1% used for infiltration Left, radial was placed Catheter size: 20 G Hand hygiene performed  and maximum sterile barriers used  Allen's test indicative of satisfactory collateral circulation Attempts: 1 Procedure performed without using ultrasound guided technique. Following insertion, Biopatch and dressing applied. Post procedure assessment: normal  Patient tolerated the procedure well with no immediate complications.

## 2017-09-09 NOTE — Progress Notes (Signed)
PHARMACIST - PHYSICIAN ORDER COMMUNICATION  CONCERNING: P&T Medication Policy on Herbal Medications  DESCRIPTION:  This patient's order for:  CoQ10  has been noted.  This product(s) is classified as an "herbal" or natural product. Due to a lack of definitive safety studies or FDA approval, nonstandard manufacturing practices, plus the potential risk of unknown drug-drug interactions while on inpatient medications, the Pharmacy and Therapeutics Committee does not permit the use of "herbal" or natural products of this type within .   ACTION TAKEN: The pharmacy department is unable to verify this order at this time and your patient has been informed of this safety policy. Please reevaluate patient's clinical condition at discharge and address if the herbal or natural product(s) should be resumed at that time.  Gurnoor Sloop S. Emmalea Treanor, PharmD, BCPS Clinical Staff Pharmacist Pager [phone]   

## 2017-09-09 NOTE — Op Note (Signed)
    OPERATIVE REPORT  DATE OF SURGERY: 09/09/2017  PATIENT: Taylor Banks, 70 y.o. female MRN: [id number]  DOB: 30-Jun-1946  PRE-OPERATIVE DIAGNOSIS: Asymptomatic severe right carotid stenosis  POST-OPERATIVE DIAGNOSIS:  Same  PROCEDURE: Right carotid endarterectomy and Dacron patch angioplasty  SURGEON:  Curt Jews, M.D.  PHYSICIAN ASSISTANT: Samantha Rhyne PA-C  ANESTHESIA:  Gen.  EBL: 50 ml  Total I/O In: 1000 [I.V.:1000] Out: 50 [Blood:50]  BLOOD ADMINISTERED: None  DRAINS: None  SPECIMEN: None  COUNTS CORRECT:  YES  PLAN OF CARE: PACU neurologically intact   PATIENT DISPOSITION:  PACU - hemodynamically stable  PROCEDURE DETAILS: The patient was taken to the operating placed supine position where the area the right neck was prepped and draped in usual sterile fashion. An incision was made anterior to the surgical mastoid and carried down through the platysma with electrocautery. The sternocleidomastoid was reflected posteriorly and the carotid sheath was opened. The facial vein was ligated with 2-0 silk ties and divided. The vagus and hypoglossal nerves were identified and preserved. The common carotid artery was encircled with an umbilical tape. The external carotid was encircled with a blue vessel loop. The superior thyroid artery was encircled with a 2-0 silk Potts tie and the internal carotid was encircled with an umbilical tape and Rummel tourniquet. The patient was given 8000 units of intravenous heparin. After adequate circulation time the internal/external and common carotid arteries were occluded. The common carotid artery was opened with 11 blade and extended through the plaque onto the internal carotid artery. The patient did have critical stenosis at the internal carotid origin. A 10 shunt was passed up the internal carotid and allowed to back bleed and then down the common carotid where was secured with Rummel tourniquet. The endarterectomy was begun on  common carotid artery and the plaque was divided proximally with Potts scissors. Endarterectomy was carried onto the bifurcation and the external carotid was endarterectomized with an eversion technique and the internal carotid was endarterectomized in an open fashion. Remaining atheromatous debris was removed from the endarterectomy plane. A Finesse Hemashield Dacron patch was brought onto the field and was sewn as a patch angioplasty with a running 6-0 Prolene suture. Prior to completion of the closure the shunt was removed and the anastomosis was completed. Flow was restored first to the external and the internal carotid artery. Excellent flow characteristics were noted with hand-held Doppler in the internal and external carotid arteries. The patient was given 50 mg of protamine to reverse the heparin. Wounds irrigated with saline. Hemostasis obtained left cautery. Wounds were closed with 3-0 Vicryl to reapproximate sternocleidomastoid over the carotid sheath. Next the platysma was running with a running 3-0 Vicryl suture. Findings skin was closed with a 40 septic or Vicryl stitch. Sterile dressing was applied and the patient was taken to the recovery room neurologically intact   Rosetta Posner, M.D., [facility] 09/09/2017 12:49 PM

## 2017-09-09 NOTE — Anesthesia Preprocedure Evaluation (Signed)
Anesthesia Evaluation  Patient identified by MRN, date of birth, ID band Patient awake    Reviewed: Allergy & Precautions, NPO status , Patient's Chart, lab work & pertinent test results  History of Anesthesia Complications Negative for: history of anesthetic complications  Airway Mallampati: II  TM Distance: >3 FB Neck ROM: Full    Dental  (+) Teeth Intact   Pulmonary neg shortness of breath, neg sleep apnea, neg COPD, neg recent URI, Current Smoker,    breath sounds clear to auscultation       Cardiovascular hypertension, + Peripheral Vascular Disease   Rhythm:Regular     Neuro/Psych negative neurological ROS  negative psych ROS   GI/Hepatic Neg liver ROS, GERD  Controlled,  Endo/Other  negative endocrine ROS  Renal/GU negative Renal ROS     Musculoskeletal negative musculoskeletal ROS (+)   Abdominal   Peds  Hematology negative hematology ROS (+)   Anesthesia Other Findings   Reproductive/Obstetrics                             Anesthesia Physical Anesthesia Plan  ASA: II  Anesthesia Plan: General   Post-op Pain Management:    Induction: Intravenous  PONV Risk Score and Plan: 2 and Ondansetron and Dexamethasone  Airway Management Planned: Oral ETT  Additional Equipment: Arterial line  Intra-op Plan:   Post-operative Plan: Extubation in OR  Informed Consent: I have reviewed the patients History and Physical, chart, labs and discussed the procedure including the risks, benefits and alternatives for the proposed anesthesia with the patient or authorized representative who has indicated his/her understanding and acceptance.   Dental advisory given  Plan Discussed with: CRNA and Surgeon  Anesthesia Plan Comments:         Anesthesia Quick Evaluation

## 2017-09-10 ENCOUNTER — Telehealth: Payer: Self-pay | Admitting: Vascular Surgery

## 2017-09-10 ENCOUNTER — Encounter (HOSPITAL_COMMUNITY): Payer: Self-pay | Admitting: Vascular Surgery

## 2017-09-10 LAB — BASIC METABOLIC PANEL
Anion gap: 6 (ref 5–15)
BUN: 13 mg/dL (ref 6–20)
CALCIUM: 8.9 mg/dL (ref 8.9–10.3)
CHLORIDE: 105 mmol/L (ref 101–111)
CO2: 24 mmol/L (ref 22–32)
CREATININE: 0.84 mg/dL (ref 0.44–1.00)
GFR calc Af Amer: >60 mL/min (ref >60)
GFR calc non Af Amer: >60 mL/min (ref >60)
Glucose, Bld: 133 mg/dL — ABNORMAL HIGH (ref 65–99)
Potassium: 4.1 mmol/L (ref 3.5–5.1)
Sodium: 135 mmol/L (ref 135–145)

## 2017-09-10 LAB — CBC
HEMATOCRIT: 36.4 % (ref 36.0–46.0)
HEMOGLOBIN: 12.7 g/dL (ref 12.0–15.0)
MCH: 32.3 pg (ref 26.0–34.0)
MCHC: 34.9 g/dL (ref 30.0–36.0)
MCV: 92.6 fL (ref 78.0–100.0)
Platelets: 191 10*3/uL (ref 150–400)
RBC: 3.93 MIL/uL (ref 3.87–5.11)
RDW: 12.4 % (ref 11.5–15.5)
WBC: 13.9 10*3/uL — ABNORMAL HIGH (ref 4.0–10.5)

## 2017-09-10 MED ORDER — OXYCODONE-ACETAMINOPHEN 5-325 MG PO TABS: 1.0000 | ORAL_TABLET | Freq: Four times a day (QID) | ORAL | 0 refills | Status: DC | PRN

## 2017-09-10 NOTE — Telephone Encounter (Signed)
Sched appt 10/06/17 at 8:45. Spoke to pt.

## 2017-09-10 NOTE — Progress Notes (Signed)
Subjective: Interval History: none.. This great this morning. Minimal discomfort from her surgical incision   Objective: Vital signs in last 24 hours: Temp:  [97.2 F (36.2 C)-98.5 F (36.9 C)] 97.7 F (36.5 C) (09/13 0400) Pulse Rate:  [45-100] 58 (09/13 0400) Resp:  [12-25] 13 (09/13 0400) BP: (91-179)/(48-132) 134/56 (09/13 0400) SpO2:  [90 %-99 %] 95 % (09/13 0400) Arterial Line BP: (149-225)/(50-115) 150/51 (09/12 1517) Weight:  [168 lb 3.4 oz (76.3 kg)] 168 lb 3.4 oz (76.3 kg) (09/12 1541)  Intake/Output from previous day: 09/12 0701 - 09/13 0700 In: 1600 [I.V.:1400; IV Piggyback:50] Out: 400 [Urine:350; Blood:50] Intake/Output this shift: No intake/output data recorded.  Neck incision without hematoma. Neurologically intact  Lab Results: No results for input(s): WBC, HGB, HCT, PLT in the last 72 hours. BMET No results for input(s): NA, K, CL, CO2, GLUCOSE, BUN, CREATININE, CALCIUM in the last 72 hours.  Studies/Results: No results found. Anti-infectives: Anti-infectives    Start     Dose/Rate Route Frequency Ordered Stop   09/09/17 2230  cefUROXime (ZINACEF) 1.5 g in dextrose 5 % 50 mL IVPB     1.5 g 100 mL/hr over 30 Minutes Intravenous Every 12 hours 09/09/17 1537 09/10/17 2229   09/09/17 0854  dextrose 5 % with cefUROXime (ZINACEF) ADS Med    Comments:  Block, Sarah   : cabinet override      09/09/17 0854 09/09/17 1056   09/09/17 0844  cefUROXime (ZINACEF) 1.5 g in dextrose 5 % 50 mL IVPB     1.5 g 100 mL/hr over 30 Minutes Intravenous 30 min pre-op 09/09/17 0844 09/09/17 1056      Assessment/Plan: s/p Procedure(s): ENDARTERECTOMY CAROTID- RIGHT (Right) PATCH ANGIOPLASTY USING HEMASHIELD PLATINUM FINESSE PATCH (Right) Stable postop day #1. No neurologic deficits. We'll Deese charged home today. No difficulty with swallowing. We'll see her back in the office in several weeks for follow-up   LOS: 1 day   Taylor Banks 09/10/2017, 7:05 AM

## 2017-09-10 NOTE — Telephone Encounter (Signed)
-----   Message from Mena Goes, RN sent at 09/10/2017  9:16 AM EDT ----- Regarding: 2 weeks with Dr. Donnetta Hutching   ----- Message ----- From: Gabriel Earing, PA-C Sent: 09/10/2017   7:28 AM To: Vvs Charge Pool  S/p right CEA.  F/u with Dr. Donnetta Hutching in 2 weeks.

## 2017-09-10 NOTE — Care Management Note (Signed)
Case Management Note Marvetta Gibbons RN, BSN Unit 4E-Case Manager [phone]  Patient Details  Name: Taylor Banks MRN: [id number] Date of Birth: 1946/12/15  Subjective/Objective:  Pt admitted s/p CEA                Action/Plan: PTA pt lived at home- for d/c today no CM needs noted for discharge.  Expected Discharge Date:  09/10/17               Expected Discharge Plan:  Home/Self Care  In-House Referral:  NA  Discharge planning Services  CM Consult  Post Acute Care Choice:  NA Choice offered to:     DME Arranged:    DME Agency:     HH Arranged:    HH Agency:     Status of Service:  Completed, signed off  If discussed at [place] of Stay Meetings, dates discussed:    Discharge Disposition: home/self care   Additional Comments:  Dawayne Patricia, RN 09/10/2017, 11:02 AM

## 2017-09-10 NOTE — Progress Notes (Signed)
A-line d/c'd with no complications. Will continue to monitor.  Jaymes Graff, RN

## 2017-09-10 NOTE — Discharge Summary (Signed)
Discharge Summary     Taylor Banks November 30, 1946 71 y.o. female  [id number]  Admission Date: 09/09/2017  Discharge Date: 09/10/17  Physician: Rosetta Posner, MD  Admission Diagnosis: Right Internal Carotid Artery Stenosis  I65.21    HPI:   This is a 70 y.o. female here today for discussion of recent duplex revealing high-grade right carotid stenosis. Forced issues remained asymptomatic. She is well-known to me from prior left carotid endarterectomy in 2007. Did well with that procedure with no complications. She denies any cardiac disease. Unfortunate she continues to smoke. She reports that she has tried to smoke on many occasions and has a significant weight gain and therefore has a continued to smoke cigarettes. I again discussed the critical importance of stopping all cigarette smoking and tobacco products.  Hospital Course:  The patient was admitted to the hospital and taken to the operating room on 09/09/2017 and underwent right carotid endarterectomy.  The pt tolerated the procedure well and was transported to the PACU in good condition.   By POD 1, the pt neuro status Pt doing well and neuro in tact.    The remainder of the hospital course consisted of increasing mobilization and increasing intake of solids without difficulty.    Recent Labs  09/10/17 0654  WBC 13.9*  HGB 12.7  HCT 36.4  PLT 191   No results for input(s): INR in the last 72 hours.   Discharge Instructions    Discharge patient    Complete by:  As directed    Discharge disposition:  01-Home or Self Care   Discharge patient date:  09/10/2017      Discharge Diagnosis:  Right Internal Carotid Artery Stenosis  I65.21   Secondary Diagnosis: Patient Active Problem List   Diagnosis Date Noted  . Asymptomatic carotid artery stenosis, right 09/09/2017  . Aftercare following surgery of the circulatory system, [place] 02/01/2014  . Occlusion and stenosis of carotid artery without mention of  cerebral infarction 02/01/2013   Past Medical History:  Diagnosis Date  . Cancer ([place])    cancerous cells in cervix -- had no treatments,just hysterectomy  . Carotid artery occlusion    Status post left CEA 2007 - Dr. Donnetta Hutching  . GERD (gastroesophageal reflux disease)    takes otc when she eats wrong foods  . Hyperlipidemia   . Hypertension   . Obesity     Allergies as of 09/10/2017   No Known Allergies     Medication List    TAKE these medications   aspirin 325 MG tablet Take 325 mg by mouth daily.   atorvastatin 20 MG tablet Commonly known as:  LIPITOR Take 20 mg by mouth daily at 6 PM.   Co Q 10 100 MG Caps Take 100 mg by mouth daily.   oxyCODONE-acetaminophen 5-325 MG tablet Commonly known as:  PERCOCET/ROXICET Take 1 tablet by mouth every 6 (six) hours as needed for moderate pain.            Discharge Care Instructions        Start     Ordered   09/10/17 0000  oxyCODONE-acetaminophen (PERCOCET/ROXICET) 5-325 MG tablet  Every 6 hours PRN    Question:  Supervising Provider  Answer:  EARLY, TODD F   09/10/17 0725   09/10/17 0000  Discharge patient    Question Answer Comment  Discharge disposition 01-Home or Self Care   Discharge patient date 09/10/2017      09/10/17 0725  Discharge Instructions: .sjrdc  [facility]  Discharge Instructions   Carotid Endarterectomy (CEA)  Please refer to the following instructions for your post-procedure care. Your surgeon or physician assistant will discuss any changes with you.  Activity  You are encouraged to walk as much as you can. You can slowly return to normal activities but must avoid strenuous activity and heavy lifting until your doctor tell you it's OK. Avoid activities such as vacuuming or swinging a golf club. You can drive after one week if you are comfortable and you are no longer taking prescription pain medications. It is normal to feel tired for serval weeks  after your surgery. It is also normal to have difficulty with sleep habits, eating, and bowel movements after surgery. These will go away with time.  Bathing/Showering  You may shower after you come home. Do not soak in a bathtub, hot tub, or swim until the incision heals completely.  Incision Care  Shower every day. Clean your incision with mild soap and water. Pat the area dry with a clean towel. You do not need a bandage unless otherwise instructed. Do not apply any ointments or creams to your incision. You may have skin glue on your incision. Do not peel it off. It will come off on its own in about one week. Your incision may feel thickened and raised for several weeks after your surgery. This is normal and the skin will soften over time. For Men Only: It's OK to shave around the incision but do not shave the incision itself for 2 weeks. It is common to have numbness under your chin that could last for several months.  Diet  Resume your normal diet. There are no special food restrictions following this procedure. A low fat/low cholesterol diet is recommended for all patients with vascular disease. In order to heal from your surgery, it is CRITICAL to get adequate nutrition. Your body requires vitamins, minerals, and protein. Vegetables are the best source of vitamins and minerals. Vegetables also provide the perfect balance of protein. Processed food has little nutritional value, so try to avoid this.  Medications  Resume taking all of your medications unless your doctor or physician assistant tells you not to. If your incision is causing pain, you may take over-the- counter pain relievers such as acetaminophen (Tylenol). If you were prescribed a stronger pain medication, please be aware these medications can cause nausea and constipation. Prevent nausea by taking the medication with a snack or meal. Avoid constipation by drinking plenty of fluids and eating foods with a high amount of fiber,  such as fruits, vegetables, and grains. Do not take Tylenol if you are taking prescription pain medications.  Follow Up  Our office will schedule a follow up appointment 2-3 weeks following discharge.  Please call us immediately for any of the following conditions  Increased pain, redness, drainage (pus) from your incision site. Fever of 101 degrees or higher. If you should develop stroke (slurred speech, difficulty swallowing, weakness on one side of your body, loss of vision) you should call 911 and go to the nearest emergency room.  Reduce your risk of vascular disease:  Stop smoking. If you would like help call [facility] at 1-800-QUIT-NOW (386)603-6602) or [place] at (208)600-5718. Manage your cholesterol Maintain a desired weight Control your diabetes Keep your blood pressure down  If you have any questions, please call the office at [phone].  Prescriptions given: Roxicet #8 No Refill  Disposition: home  Patient's condition: is Good  Follow up: 1. Dr. Donnetta Hutching in 2 weeks.   Leontine Locket, PA-C [facility] [phone]   --- For [facility] use ---   Modified Rankin score at D/C (0-6): 0  IV medication needed for:  1. Hypertension: No 2. Hypotension: No  Post-op Complications: No  1. Post-op CVA or TIA: No  If yes: Event classification (right eye, left eye, right cortical, left cortical, verterobasilar, other): n/a  If yes: Timing of event (intra-op, <6 hrs post-op, >=6 hrs post-op, unknown): n/a  2. CN injury: No  If yes: CN n/a injuried   3. Myocardial infarction: No  If yes: Dx by (EKG or clinical, Troponin): n/a  4.  CHF: No  5.  Dysrhythmia (new): No  6. Wound infection: No  7. Reperfusion symptoms: No  8. Return to OR: No  If yes: return to OR for (bleeding, neurologic, other CEA incision, other): n/a  Discharge medications: Statin use:  Yes   If No:   ASA use:  Yes  If No:   Beta blocker use:   No ACE-Inhibitor use:  No  ARB use:  No CCB use: No P2Y12 Antagonist use: No, [ ]  Plavix, [ ]  Plasugrel, [ ]  Ticlopinine, [ ]  Ticagrelor, [ ]  Other, [ ]  No for medical reason, [ ]  Non-compliant, [ ]  Not-indicated Anti-coagulant use:  No, [ ]  Warfarin, [ ]  Rivaroxaban, [ ]  Dabigatran,

## 2017-09-11 NOTE — Consult Note (Signed)
           [facility] CM Primary Care Navigator  09/11/2017  Taylor Banks 06-Apr-1946 [id number]    Attempt to seepatient at the bedsideto identify possible discharge needs but she was already discharged per staffreport. Patient was discharged home yesterday.  Primary care provider's office called Gerarda Fraction, [place]) to notify of patient's discharge and need for post hospital follow-up and transition of care. Made aware to refer patient to [facility] CM if deemed appropriate for services.   Noted that patient was scheduled per [place] Comment for follow-upappointment with provider (vascular) on 10/06/17 at 8:45 am.   For questions, please contact:  Dannielle Huh, BSN, RN- [facility] Primary Care Navigator  Telephone: [phone] [place]

## 2017-09-11 NOTE — Anesthesia Postprocedure Evaluation (Signed)
Anesthesia Post Note  Patient: DARLISA SPRUIELL  Procedure(s) Performed: Procedure(s) (LRB): ENDARTERECTOMY CAROTID- RIGHT (Right) PATCH ANGIOPLASTY USING HEMASHIELD PLATINUM FINESSE PATCH (Right)     Patient location during evaluation: PACU Anesthesia Type: General Level of consciousness: awake and alert Pain management: pain level controlled Vital Signs Assessment: post-procedure vital signs reviewed and stable Respiratory status: spontaneous breathing, nonlabored ventilation, respiratory function stable and patient connected to nasal cannula oxygen Cardiovascular status: blood pressure returned to baseline and stable Postop Assessment: no apparent nausea or vomiting Anesthetic complications: no    Last Vitals:  Vitals:   09/10/17 0400 09/10/17 0738  BP: (!) 134/56 (!) 153/64  Pulse: (!) 58 (!) 56  Resp: 13 18  Temp: 36.5 C 36.6 C  SpO2: 95% 97%    Last Pain:  Vitals:   09/10/17 0738  TempSrc: Oral  PainSc:                  Arda Daggs

## 2017-10-06 ENCOUNTER — Encounter: Payer: Self-pay | Admitting: Vascular Surgery

## 2017-10-06 ENCOUNTER — Ambulatory Visit (INDEPENDENT_AMBULATORY_CARE_PROVIDER_SITE_OTHER): Payer: Self-pay | Admitting: Vascular Surgery

## 2017-10-06 VITALS — BP 143/75 | HR 57 | Temp 98.0°F | Resp 16 | Ht 60.0 in | Wt 168.0 lb

## 2017-10-06 DIAGNOSIS — I6521 Occlusion and stenosis of right carotid artery: Secondary | ICD-10-CM

## 2017-10-06 NOTE — Progress Notes (Signed)
Vitals:   10/06/17 0851 10/06/17 0853 10/06/17 0854  BP: (!) 159/69 (!) 142/70 (!) 143/75  Pulse: (!) 57 (!) 57 (!) 57  Resp: 16    Temp: 98 F (36.7 C)    SpO2: 95%    Weight: 168 lb (76.2 kg)    Height: 5' (1.524 m)

## 2017-10-06 NOTE — Progress Notes (Signed)
   Patient name: Taylor Banks MRN: [id number] DOB: 04/24/1946 Sex: female  REASON FOR VISIT: Follow-up right carotid endarterectomy for severe asymptomatic carotid disease on 09/09/2017  HPI: Taylor Banks is a 71 y.o. female here today for follow-up. She did very well with her surgery was discharged home on postoperative day 1. She reports no neurologic deficits and minimal discomfort. She does have typical. Incisional numbness.  Current Outpatient Prescriptions  Medication Sig Dispense Refill  . aspirin 325 MG tablet Take 325 mg by mouth daily.    Marland Kitchen atorvastatin (LIPITOR) 20 MG tablet Take 20 mg by mouth daily at 6 PM.     . Coenzyme Q10 (CO Q 10) 100 MG CAPS Take 100 mg by mouth daily.    Marland Kitchen oxyCODONE-acetaminophen (PERCOCET/ROXICET) 5-325 MG tablet Take 1 tablet by mouth every 6 (six) hours as needed for moderate pain. (Patient not taking: Reported on 10/06/2017) 8 tablet 0   No current facility-administered medications for this visit.      PHYSICAL EXAM: Vitals:   10/06/17 0851 10/06/17 0853 10/06/17 0854  BP: (!) 159/69 (!) 142/70 (!) 143/75  Pulse: (!) 57 (!) 57 (!) 57  Resp: 16    Temp: 98 F (36.7 C)    SpO2: 95%    Weight: 168 lb (76.2 kg)    Height: 5' (1.524 m)      GENERAL: The patient is a well-nourished female, in no acute distress. The vital signs are documented above. Right neck incision is well-healed with the typical healing ridge. No carotid bruits bilaterally  MEDICAL ISSUES: Stable status post right carotid endarterectomy. Had had left carotid endarterectomy in the past. We'll continue full activities. We will see her again in 6 months with repeat bilateral carotid duplex evaluation   Rosetta Posner, MD [facility] Tel [phone] Pager [phone]

## 2018-02-23 DIAGNOSIS — H52223 Regular astigmatism, bilateral: Secondary | ICD-10-CM | POA: Diagnosis not present

## 2018-02-23 DIAGNOSIS — Z961 Presence of intraocular lens: Secondary | ICD-10-CM | POA: Diagnosis not present

## 2018-02-23 DIAGNOSIS — E119 Type 2 diabetes mellitus without complications: Secondary | ICD-10-CM | POA: Diagnosis not present

## 2018-02-23 DIAGNOSIS — H524 Presbyopia: Secondary | ICD-10-CM | POA: Diagnosis not present

## 2018-02-26 DIAGNOSIS — I708 Atherosclerosis of other arteries: Secondary | ICD-10-CM | POA: Diagnosis not present

## 2018-02-26 DIAGNOSIS — I10 Essential (primary) hypertension: Secondary | ICD-10-CM | POA: Diagnosis not present

## 2018-02-26 DIAGNOSIS — Z0000 Encounter for general adult medical examination without abnormal findings: Secondary | ICD-10-CM | POA: Diagnosis not present

## 2018-02-26 DIAGNOSIS — R109 Unspecified abdominal pain: Secondary | ICD-10-CM | POA: Diagnosis not present

## 2018-02-26 DIAGNOSIS — E782 Mixed hyperlipidemia: Secondary | ICD-10-CM | POA: Diagnosis not present

## 2018-02-26 DIAGNOSIS — E6609 Other obesity due to excess calories: Secondary | ICD-10-CM | POA: Diagnosis not present

## 2018-02-26 DIAGNOSIS — Z719 Counseling, unspecified: Secondary | ICD-10-CM | POA: Diagnosis not present

## 2018-02-26 DIAGNOSIS — Z6830 Body mass index (BMI) 30.0-30.9, adult: Secondary | ICD-10-CM | POA: Diagnosis not present

## 2018-02-26 DIAGNOSIS — Z1389 Encounter for screening for other disorder: Secondary | ICD-10-CM | POA: Diagnosis not present

## 2018-02-26 DIAGNOSIS — F17290 Nicotine dependence, other tobacco product, uncomplicated: Secondary | ICD-10-CM | POA: Diagnosis not present

## 2018-02-26 DIAGNOSIS — E119 Type 2 diabetes mellitus without complications: Secondary | ICD-10-CM | POA: Diagnosis not present

## 2018-02-26 DIAGNOSIS — E669 Obesity, unspecified: Secondary | ICD-10-CM | POA: Diagnosis not present

## 2018-03-09 DIAGNOSIS — Z1389 Encounter for screening for other disorder: Secondary | ICD-10-CM | POA: Diagnosis not present

## 2018-03-09 DIAGNOSIS — R109 Unspecified abdominal pain: Secondary | ICD-10-CM | POA: Diagnosis not present

## 2018-04-06 ENCOUNTER — Other Ambulatory Visit: Payer: Self-pay

## 2018-04-06 ENCOUNTER — Encounter: Payer: Self-pay | Admitting: Vascular Surgery

## 2018-04-06 ENCOUNTER — Ambulatory Visit: Payer: PPO | Admitting: Vascular Surgery

## 2018-04-06 ENCOUNTER — Ambulatory Visit (HOSPITAL_COMMUNITY)
Admission: RE | Admit: 2018-04-06 | Discharge: 2018-04-06 | Disposition: A | Discharge status: Home / Self Care | Payer: PPO | Source: Ambulatory Visit | Attending: Vascular Surgery | Admitting: Vascular Surgery

## 2018-04-06 VITALS — BP 150/74 | HR 63 | Resp 18 | Ht 60.0 in | Wt 169.5 lb

## 2018-04-06 DIAGNOSIS — I6523 Occlusion and stenosis of bilateral carotid arteries: Secondary | ICD-10-CM | POA: Diagnosis not present

## 2018-04-06 DIAGNOSIS — I6521 Occlusion and stenosis of right carotid artery: Secondary | ICD-10-CM

## 2018-04-06 NOTE — Progress Notes (Signed)
[facility] [place]  Patient name: Taylor Banks MRN: [id number] DOB: [birth year] Sex: female  REASON FOR VISIT: Follow-up carotid disease.  HPI: Taylor Banks is a 72 y.o. female here today for follow-up of her right carotid endarterectomy on 09/09/2017.  She has a history of left carotid endarterectomy in 2007.  The reports no neurologic deficits.  She does have some continued numbness under her right anterior chin.  Past Medical History:  Diagnosis Date  . Cancer ([place])    cancerous cells in cervix -- had no treatments,just hysterectomy  . Carotid artery occlusion    Status post left CEA 2007 - Dr. Donnetta Hutching  . GERD (gastroesophageal reflux disease)    takes otc when she eats wrong foods  . Hyperlipidemia   . Hypertension   . Obesity     Family History  Problem Relation Age of Onset  . Stroke Mother   . Hypertension Mother   . Stroke Father   . Diabetes Sister   . Diabetes Sister   . Heart disease Brother   . Heart disease Son     SOCIAL HISTORY: Social History   Tobacco Use  . Smoking status: Current Every Day Smoker    Packs/day: 1.00    Years: 35.00    Pack years: 35.00    Types: Cigarettes  . Smokeless tobacco: Never Used  . Tobacco comment: 'she's trying, used the patch, did go cold [place]  Substance Use Topics  . Alcohol use: No    No Known Allergies  Current Outpatient Medications  Medication Sig Dispense Refill  . aspirin 325 MG tablet Take 325 mg by mouth daily.    Marland Kitchen atorvastatin (LIPITOR) 20 MG tablet Take 20 mg by mouth daily at 6 PM.     . Coenzyme Q10 (CO Q 10) 100 MG CAPS Take 100 mg by mouth daily.    Marland Kitchen oxyCODONE-acetaminophen (PERCOCET/ROXICET) 5-325 MG tablet Take 1 tablet by mouth every 6 (six) hours as needed for moderate pain. (Patient not taking: Reported on 10/06/2017) 8 tablet 0   No current facility-administered medications for this visit.     REVIEW OF SYSTEMS:    [X]  denotes positive finding, [ ]  denotes negative finding Cardiac  Comments:  Chest pain or chest pressure:    Shortness of breath upon exertion:    Short of breath when lying flat:    Irregular heart rhythm:        Vascular    Pain in calf, thigh, or hip brought on by ambulation:    Pain in feet at night that wakes you up from your sleep:     Blood clot in your veins:    Leg swelling:           PHYSICAL EXAM: Vitals:   04/06/18 0947 04/06/18 0948  BP: (!) 159/74 (!) 150/74  Pulse: 63   Resp: 18   SpO2: 96%   Weight: 169 lb 8 oz (76.9 kg)   Height: 5' (1.524 m)     GENERAL: The patient is a well-nourished female, in no acute distress. The vital signs are documented above. CARDIOVASCULAR: Both carotid arteries are well-healed with a soft left carotid bruit. PULMONARY: There is good air exchange  MUSCULOSKELETAL: There are no major deformities or cyanosis. NEUROLOGIC: No focal weakness or paresthesias are detected. SKIN: There are no ulcers or rashes noted. PSYCHIATRIC: The patient has a normal affect.  DATA:  Plaque shows widely patent right endarterectomy.  On the  left she does have a moderate 60-79% stenosis  MEDICAL ISSUES: Discussed these findings with the patient.  I recommend that we see her again in 6 months for continued carotid duplex surveillance.  Again reviewed symptoms of carotid disease which she would notify us immediately should this occur.    Rosetta Posner, MD FACS [facility] Tel [phone] Pager [phone]

## 2018-04-07 ENCOUNTER — Other Ambulatory Visit: Payer: Self-pay

## 2018-04-07 DIAGNOSIS — I6523 Occlusion and stenosis of bilateral carotid arteries: Secondary | ICD-10-CM

## 2018-10-12 ENCOUNTER — Ambulatory Visit: Payer: PPO | Admitting: Family

## 2018-10-12 ENCOUNTER — Encounter (HOSPITAL_COMMUNITY): Payer: PPO

## 2019-06-09 DIAGNOSIS — Z0001 Encounter for general adult medical examination with abnormal findings: Secondary | ICD-10-CM | POA: Diagnosis not present

## 2019-06-09 DIAGNOSIS — E782 Mixed hyperlipidemia: Secondary | ICD-10-CM | POA: Diagnosis not present

## 2019-06-09 DIAGNOSIS — I10 Essential (primary) hypertension: Secondary | ICD-10-CM | POA: Diagnosis not present

## 2019-06-09 DIAGNOSIS — E6609 Other obesity due to excess calories: Secondary | ICD-10-CM | POA: Diagnosis not present

## 2019-06-09 DIAGNOSIS — E7849 Other hyperlipidemia: Secondary | ICD-10-CM | POA: Diagnosis not present

## 2019-06-09 DIAGNOSIS — Z6826 Body mass index (BMI) 26.0-26.9, adult: Secondary | ICD-10-CM | POA: Diagnosis not present

## 2019-06-09 DIAGNOSIS — E119 Type 2 diabetes mellitus without complications: Secondary | ICD-10-CM | POA: Diagnosis not present

## 2019-06-09 DIAGNOSIS — Z1389 Encounter for screening for other disorder: Secondary | ICD-10-CM | POA: Diagnosis not present

## 2019-06-09 DIAGNOSIS — L821 Other seborrheic keratosis: Secondary | ICD-10-CM | POA: Diagnosis not present

## 2019-06-09 DIAGNOSIS — I708 Atherosclerosis of other arteries: Secondary | ICD-10-CM | POA: Diagnosis not present

## 2019-06-09 DIAGNOSIS — E663 Overweight: Secondary | ICD-10-CM | POA: Diagnosis not present

## 2019-06-28 DIAGNOSIS — Z1283 Encounter for screening for malignant neoplasm of skin: Secondary | ICD-10-CM | POA: Diagnosis not present

## 2019-06-28 DIAGNOSIS — L821 Other seborrheic keratosis: Secondary | ICD-10-CM | POA: Diagnosis not present

## 2019-07-07 DIAGNOSIS — Z1211 Encounter for screening for malignant neoplasm of colon: Secondary | ICD-10-CM | POA: Diagnosis not present

## 2019-07-11 ENCOUNTER — Other Ambulatory Visit (HOSPITAL_COMMUNITY): Payer: Self-pay | Admitting: Internal Medicine

## 2019-07-11 DIAGNOSIS — Z1231 Encounter for screening mammogram for malignant neoplasm of breast: Secondary | ICD-10-CM

## 2019-07-21 ENCOUNTER — Other Ambulatory Visit: Payer: Self-pay

## 2019-07-21 ENCOUNTER — Ambulatory Visit (HOSPITAL_COMMUNITY)
Admission: RE | Admit: 2019-07-21 | Discharge: 2019-07-21 | Disposition: A | Discharge status: Home / Self Care | Payer: PPO | Source: Ambulatory Visit | Attending: Internal Medicine | Admitting: Internal Medicine

## 2019-07-21 DIAGNOSIS — Z1231 Encounter for screening mammogram for malignant neoplasm of breast: Secondary | ICD-10-CM | POA: Diagnosis not present

## 2019-08-30 DIAGNOSIS — H409 Unspecified glaucoma: Secondary | ICD-10-CM | POA: Diagnosis not present

## 2019-10-06 DIAGNOSIS — H25813 Combined forms of age-related cataract, bilateral: Secondary | ICD-10-CM | POA: Diagnosis not present

## 2019-10-06 DIAGNOSIS — H40013 Open angle with borderline findings, low risk, bilateral: Secondary | ICD-10-CM | POA: Diagnosis not present

## 2019-10-17 DIAGNOSIS — H25812 Combined forms of age-related cataract, left eye: Secondary | ICD-10-CM | POA: Diagnosis not present

## 2019-10-18 NOTE — H&P (Signed)
Surgical History & Physical  Patient Name: Taylor Banks DOB: September 15, 1946  Surgery: Cataract extraction with intraocular lens implant phacoemulsification; Left Eye  Surgeon: Baruch Goldmann MD Surgery Date:  10/24/2019 Pre-Op Date:  10/06/2019  HPI: A 27 Yr. old female patient (referred by Dr. Jorja Loa) 1. The patient complains of difficulty when driving, which began 4 months ago. Both eyes are affected. The episode is constant and gradual. The patient describes foggy, glare and hazy symptoms affecting their eyes/vision. The condition's severity decreased since last visit. Symptoms occur when the patient is driving and outside. Distance vision is worse than near. Patient not as comfortable driving - during day. Glare from sun makes VA worse. OS worse c/w OD. This is negatively affecting the patient's quality of life. Patient interested in considering cataract surgery for BCVA. *Patient not aware of any glaucoma for herself or family (suspect per Dr. Jorja Loa). HPI was performed by Baruch Goldmann .  Medical History: Glaucoma Cataracts Cancer Heart Problem High Blood Pressure  Review of Systems  Negative Allergic/Immunologic Negative Cardiovascular Negative Constitutional Negative Ear, Nose, Mouth & Throat Negative Endocrine Negative Eyes Negative Gastrointestinal Negative Genitourinary Negative Hemotologic/Lymphatic Negative Integumentary Negative Musculoskeletal Negative Neurological Negative Psychiatry Negative Respiratory  Social   Current every day smoker   Medication Aspirin, Losartan Potassium,   Sx/Procedures Hysterectomy, Carotid Artery Sx,   Drug Allergies   NKDA  History & Physical: Heent:  Cataract, Left eye NECK: supple without bruits LUNGS: lungs clear to auscultation CV: regular rate and rhythm Abdomen: soft and non-tender  Impression & Plan: Assessment: 1.  COMBINED FORMS AGE RELATED CATARACT; Both Eyes (H25.813) 2.  ASTIGMATISM, REGULAR; Both Eyes  (H52.223) 3.  OAG BORDERLINE FINDINGS LOW RISK; Both Eyes (H40.013)  Plan: 1.  Cataract accounts for the patient's decreased vision. This visual impairment is not correctable with a tolerable change in glasses or contact lenses. Cataract surgery with an implantation of a new lens should significantly improve the visual and functional status of the patient. Discussed all risks, benefits, alternatives, and potential complications. Discussed the procedures and recovery. Patient desires to have surgery. A-scan ordered and performed today for intra-ocular lens calculations. The surgery will be performed in order to improve vision for driving, reading, and for eye examinations. Recommend phacoemulsification with intra-ocular lens. Left Eye worst - first. Dilates well - shugarcaine by protocol. Symfony Toric Lens. 2.  worse OS - recommend toric (symfony) No toric for OD. 3.  Based on cup-to-disc ratio. Negative Family history. OCT rFNL today shows: thinning OU inferior. Detailed discussion about glaucoma today including importance of maintaining good follow up and following treatment plan, and the possibility of irreversible blindness as part of this disease process.

## 2019-10-20 ENCOUNTER — Encounter (HOSPITAL_COMMUNITY)
Admission: RE | Admit: 2019-10-20 | Discharge: 2019-10-20 | Disposition: A | Discharge status: Home / Self Care | Payer: PPO | Source: Ambulatory Visit | Attending: Ophthalmology | Admitting: Ophthalmology

## 2019-10-20 ENCOUNTER — Other Ambulatory Visit (HOSPITAL_COMMUNITY)
Admission: RE | Admit: 2019-10-20 | Discharge: 2019-10-20 | Disposition: A | Discharge status: Home / Self Care | Payer: PPO | Source: Ambulatory Visit | Attending: Ophthalmology | Admitting: Ophthalmology

## 2019-10-20 ENCOUNTER — Other Ambulatory Visit: Payer: Self-pay

## 2019-10-20 DIAGNOSIS — Z20828 Contact with and (suspected) exposure to other viral communicable diseases: Secondary | ICD-10-CM | POA: Diagnosis not present

## 2019-10-20 DIAGNOSIS — Z01812 Encounter for preprocedural laboratory examination: Secondary | ICD-10-CM | POA: Insufficient documentation

## 2019-10-20 LAB — SARS CORONAVIRUS 2 (TAT 6-24 HRS): SARS Coronavirus 2: NEGATIVE

## 2019-10-24 ENCOUNTER — Ambulatory Visit (HOSPITAL_COMMUNITY): Payer: PPO | Admitting: Anesthesiology

## 2019-10-24 ENCOUNTER — Encounter (HOSPITAL_COMMUNITY): Payer: Self-pay | Admitting: Anesthesiology

## 2019-10-24 ENCOUNTER — Ambulatory Visit (HOSPITAL_COMMUNITY)
Admission: RE | Admit: 2019-10-24 | Discharge: 2019-10-24 | Disposition: A | Discharge status: Home / Self Care | Payer: PPO | Attending: Ophthalmology | Admitting: Ophthalmology

## 2019-10-24 ENCOUNTER — Encounter (HOSPITAL_COMMUNITY)
Admission: RE | Disposition: A | Discharge status: Home / Self Care | Payer: Self-pay | Source: Home / Self Care | Attending: Ophthalmology

## 2019-10-24 DIAGNOSIS — Z7982 Long term (current) use of aspirin: Secondary | ICD-10-CM | POA: Insufficient documentation

## 2019-10-24 DIAGNOSIS — H25819 Combined forms of age-related cataract, unspecified eye: Secondary | ICD-10-CM | POA: Diagnosis present

## 2019-10-24 DIAGNOSIS — H52202 Unspecified astigmatism, left eye: Secondary | ICD-10-CM | POA: Insufficient documentation

## 2019-10-24 DIAGNOSIS — H25812 Combined forms of age-related cataract, left eye: Secondary | ICD-10-CM | POA: Diagnosis not present

## 2019-10-24 DIAGNOSIS — K449 Diaphragmatic hernia without obstruction or gangrene: Secondary | ICD-10-CM | POA: Insufficient documentation

## 2019-10-24 DIAGNOSIS — I10 Essential (primary) hypertension: Secondary | ICD-10-CM | POA: Diagnosis not present

## 2019-10-24 DIAGNOSIS — Z79899 Other long term (current) drug therapy: Secondary | ICD-10-CM | POA: Diagnosis not present

## 2019-10-24 DIAGNOSIS — H2512 Age-related nuclear cataract, left eye: Secondary | ICD-10-CM | POA: Diagnosis not present

## 2019-10-24 DIAGNOSIS — K219 Gastro-esophageal reflux disease without esophagitis: Secondary | ICD-10-CM | POA: Diagnosis not present

## 2019-10-24 DIAGNOSIS — Z90710 Acquired absence of both cervix and uterus: Secondary | ICD-10-CM | POA: Insufficient documentation

## 2019-10-24 DIAGNOSIS — F17200 Nicotine dependence, unspecified, uncomplicated: Secondary | ICD-10-CM | POA: Diagnosis not present

## 2019-10-24 HISTORY — PX: CATARACT EXTRACTION W/PHACO: SHX586

## 2019-10-24 SURGERY — PHACOEMULSIFICATION, CATARACT, WITH IOL INSERTION
Anesthesia: Monitor Anesthesia Care | Site: Eye | Laterality: Left

## 2019-10-24 MED ORDER — TETRACAINE HCL 0.5 % OP SOLN
1.0000 [drp] | OPHTHALMIC | Status: AC | PRN
  Administered 2019-10-24 (×3): 1 [drp] via OPHTHALMIC

## 2019-10-24 MED ORDER — NEOMYCIN-POLYMYXIN-DEXAMETH 3.5-10000-0.1 OP SUSP
OPHTHALMIC | Status: DC | PRN
  Administered 2019-10-24: 1 [drp] via OPHTHALMIC

## 2019-10-24 MED ORDER — PHENYLEPHRINE HCL 2.5 % OP SOLN
1.0000 [drp] | OPHTHALMIC | Status: AC | PRN
  Administered 2019-10-24 (×3): 1 [drp] via OPHTHALMIC

## 2019-10-24 MED ORDER — PROVISC 10 MG/ML IO SOLN
INTRAOCULAR | Status: DC | PRN
  Administered 2019-10-24: 0.85 mL via INTRAOCULAR

## 2019-10-24 MED ORDER — SODIUM HYALURONATE 23 MG/ML IO SOLN
INTRAOCULAR | Status: DC | PRN
  Administered 2019-10-24: 0.6 mL via INTRAOCULAR

## 2019-10-24 MED ORDER — CYCLOPENTOLATE-PHENYLEPHRINE 0.2-1 % OP SOLN
1.0000 [drp] | OPHTHALMIC | Status: AC | PRN
  Administered 2019-10-24 (×3): 1 [drp] via OPHTHALMIC

## 2019-10-24 MED ORDER — LIDOCAINE HCL 3.5 % OP GEL: 1.0000 "application " | Freq: Once | OPHTHALMIC | Status: DC

## 2019-10-24 MED ORDER — POVIDONE-IODINE 5 % OP SOLN
OPHTHALMIC | Status: DC | PRN
  Administered 2019-10-24: 1 via OPHTHALMIC

## 2019-10-24 MED ORDER — BSS IO SOLN
INTRAOCULAR | Status: DC | PRN
  Administered 2019-10-24: 15 mL via INTRAOCULAR

## 2019-10-24 MED ORDER — LIDOCAINE HCL (PF) 1 % IJ SOLN
INTRAOCULAR | Status: DC | PRN
  Administered 2019-10-24: 14:00:00 1 mL via OPHTHALMIC

## 2019-10-24 MED ORDER — EPINEPHRINE PF 1 MG/ML IJ SOLN
INTRAOCULAR | Status: DC | PRN
  Administered 2019-10-24: 500 mL

## 2019-10-24 MED ORDER — MIDAZOLAM HCL 2 MG/2ML IJ SOLN
INTRAMUSCULAR | Status: AC
  Filled 2019-10-24: qty 2

## 2019-10-24 MED ORDER — EPINEPHRINE PF 1 MG/ML IJ SOLN
INTRAMUSCULAR | Status: AC
  Filled 2019-10-24: qty 2

## 2019-10-24 SURGICAL SUPPLY — 16 items
CLOTH BEACON ORANGE TIMEOUT ST (SAFETY) ×3 IMPLANT
EYE SHIELD UNIVERSAL CLEAR (GAUZE/BANDAGES/DRESSINGS) ×3 IMPLANT
GLOVE BIOGEL PI IND STRL 7.0 (GLOVE) ×2 IMPLANT
GLOVE BIOGEL PI INDICATOR 7.0 (GLOVE) ×4
LENS IOL SYMFON TORIC 150 25.0 ×1 IMPLANT
LENS IOL SYMFONY TORIC 25.0 ×2 IMPLANT
LENS IOL SYMFONY TRC 150 25.0 ×1 IMPLANT
NEEDLE HYPO 18GX1.5 BLUNT FILL (NEEDLE) ×3 IMPLANT
PAD ARMBOARD 7.5X6 YLW CONV (MISCELLANEOUS) ×3 IMPLANT
PROC W SPEC LENS (INTRAOCULAR LENS) ×3
PROCESS W SPEC LENS (INTRAOCULAR LENS) ×1 IMPLANT
SYR TB 1ML LL NO SAFETY (SYRINGE) ×3 IMPLANT
TAPE SURG TRANSPORE 1 IN (GAUZE/BANDAGES/DRESSINGS) ×1 IMPLANT
TAPE SURGICAL TRANSPORE 1 IN (GAUZE/BANDAGES/DRESSINGS) ×2
VISCOELASTIC ADDITIONAL (OPHTHALMIC RELATED) ×3 IMPLANT
WATER STERILE IRR 250ML POUR (IV SOLUTION) ×3 IMPLANT

## 2019-10-24 NOTE — Interval H&P Note (Signed)
History and Physical Interval Note: The H and P was reviewed and updated. The patient was examined.  No changes were found after exam.  The surgical eye was marked.  10/24/2019 1:20 PM  Taylor Banks  has presented today for surgery, with the diagnosis of Nuclear sclerotic cataract - Left eye.  The various methods of treatment have been discussed with the patient and family. After consideration of risks, benefits and other options for treatment, the patient has consented to  Procedure(s) with comments: CATARACT EXTRACTION PHACO AND INTRAOCULAR LENS PLACEMENT (IOC) (Left) - CDE:  as a surgical intervention.  The patient's history has been reviewed, patient examined, no change in status, stable for surgery.  I have reviewed the patient's chart and labs.  Questions were answered to the patient's satisfaction.     Baruch Goldmann

## 2019-10-24 NOTE — Transfer of Care (Signed)
Immediate Anesthesia Transfer of Care Note  Patient: Taylor Banks  Procedure(s) Performed: CATARACT EXTRACTION PHACO AND INTRAOCULAR LENS PLACEMENT LEFT EYE (Left Eye)  Patient Location: Short Stay  Anesthesia Type:MAC  Level of Consciousness: awake, alert , oriented and patient cooperative  Airway & Oxygen Therapy: Patient Spontanous Breathing  Post-op Assessment: Report given to RN and Post -op Vital signs reviewed and stable  Post vital signs: Reviewed and stable  Last Vitals:  Vitals Value Taken Time  BP    Temp    Pulse    Resp    SpO2      Last Pain:  Vitals:   10/24/19 1236  TempSrc: Oral  PainSc: 0-No pain      Patients Stated Pain Goal: 8 (81/15/72 [id number])  Complications: No apparent anesthesia complications

## 2019-10-24 NOTE — Discharge Instructions (Signed)
Please discharge patient when stable, will follow up today with Dr. Livana Yerian at the [facility] office immediately following discharge.  Leave shield in place until visit.  All paperwork with discharge instructions will be given at the office. ° °

## 2019-10-24 NOTE — Op Note (Signed)
Date of procedure: 10/01/18  Pre-operative diagnosis: Visually significant age-related cataract, Left Eye; Visually Significant Astigmatism, Left Eye (H25.?2)  Post-operative diagnosis: Visually significant age-related cataract, Left Eye; Visually Significant Astigmatism, Left Eye  Procedure: Removal of cataract via phacoemulsification and insertion of intra-ocular lens AMO ZXT +25.0D into the capsular bag of the Left Eye  Attending surgeon: Gerda Diss. Joany Khatib, MD, MA  Anesthesia: MAC, Topical Akten  Complications: None  Estimated Blood Loss: <70m (minimal)  Specimens: None  Implants: As above  Indications:  Visually significant age-related cataract, Left Eye; Visually Significant Astigmatism, Left Eye  Procedure:  The patient was seen and identified in the pre-operative area. The operative eye was identified and dilated.  The operative eye was marked.  Pre-operative toric markers were used to mark the eye at 0 and 180 degrees. Topical anesthesia was administered to the operative eye.     The patient was then to the operative suite and placed in the supine position.  A timeout was performed confirming the patient, procedure to be performed, and all other relevant information.   The patient's face was prepped and draped in the usual fashion for intra-ocular surgery.  A lid speculum was placed into the operative eye and the surgical microscope moved into place and focused.  A superotemporal paracentesis was created using a 20 gauge paracentesis blade.  Shugarcaine was injected into the anterior chamber.  Viscoelastic was injected into the anterior chamber.  A temporal clear-corneal main wound incision was created using a 2.474mmicrokeratome.  A continuous curvilinear capsulorrhexis was initiated using an irrigating cystitome and completed using capsulorrhexis forceps.  Hydrodissection and hydrodeliniation were performed.  Viscoelastic was injected into the anterior chamber.  A  phacoemulsification handpiece and a chopper as a second instrument were used to remove the nucleus and epinucleus. The irrigation/aspiration handpiece was used to remove any remaining cortical material.   The capsular bag was reinflated with viscoelastic, checked, and found to be intact.  The eye was marked to the per-op meridian.  The intraocular lens was inserted into the capsular bag and dialed into place using a Kuglen hook to 176 degrees.  The irrigation/aspiration handpiece was used to remove any remaining viscoelastic.  The clear corneal wound and paracentesis wounds were then hydrated and checked with Weck-Cels to be watertight.  The lid-speculum and drape was removed, and the patient's face was cleaned with a wet and dry 4x4.  Maxitrol was instilled in the eye before a clear shield was taped over the eye. The patient was taken to the post-operative care unit in good condition, having tolerated the procedure well.  Post-Op Instructions: [facility] and will receive all other orders and instructions.

## 2019-10-24 NOTE — Anesthesia Preprocedure Evaluation (Addendum)
Anesthesia Evaluation  Patient identified by MRN, date of birth, ID band Patient awake    Reviewed: Allergy & Precautions, NPO status , Patient's Chart, lab work & pertinent test results  History of Anesthesia Complications (+) history of anesthetic complications  Airway Mallampati: II  TM Distance: >3 FB Neck ROM: Full    Dental  (+) Missing   Pulmonary Current SmokerPatient did not abstain from smoking.,    Pulmonary exam normal breath sounds clear to auscultation       Cardiovascular Exercise Tolerance: Good hypertension, Pt. on medications Normal cardiovascular exam Rhythm:Regular Rate:Normal     Neuro/Psych negative neurological ROS  negative psych ROS   GI/Hepatic Neg liver ROS, Hiatal hernia: not on meds., GERD  ,  Endo/Other  negative endocrine ROS  Renal/GU negative Renal ROS  negative genitourinary   Musculoskeletal   Abdominal   Peds  Hematology negative hematology ROS (+)   Anesthesia Other Findings   Reproductive/Obstetrics                            Anesthesia Physical Anesthesia Plan  ASA: II  Anesthesia Plan: MAC   Post-op Pain Management:    Induction:   PONV Risk Score and Plan:   Airway Management Planned: Nasal Cannula and Natural Airway  Additional Equipment:   Intra-op Plan:   Post-operative Plan:   Informed Consent: I have reviewed the patients History and Physical, chart, labs and discussed the procedure including the risks, benefits and alternatives for the proposed anesthesia with the patient or authorized representative who has indicated his/her understanding and acceptance.     Dental advisory given  Plan Discussed with: CRNA  Anesthesia Plan Comments:         Anesthesia Quick Evaluation

## 2019-10-24 NOTE — Anesthesia Postprocedure Evaluation (Signed)
Anesthesia Post Note  Patient: Taylor Banks  Procedure(s) Performed: CATARACT EXTRACTION PHACO AND INTRAOCULAR LENS PLACEMENT LEFT EYE (Left Eye)  Patient location during evaluation: Short Stay Anesthesia Type: MAC Level of consciousness: awake and alert Pain management: pain level controlled Vital Signs Assessment: post-procedure vital signs reviewed and stable Respiratory status: spontaneous breathing Cardiovascular status: stable Anesthetic complications: no     Last Vitals:  Vitals:   10/24/19 1236  BP: (!) 167/66  Pulse: (!) 58  Resp: 20  Temp: 36.6 C    Last Pain:  Vitals:   10/24/19 1236  TempSrc: Oral  PainSc: 0-No pain                 Everette Rank

## 2019-10-25 ENCOUNTER — Encounter (HOSPITAL_COMMUNITY): Payer: Self-pay | Admitting: Ophthalmology

## 2019-10-25 NOTE — Addendum Note (Signed)
Addendum  created 10/25/19 0806 by Mickel Baas, CRNA   Charge Capture section accepted

## 2019-11-01 NOTE — H&P (Addendum)
Surgical History & Physical  Patient Name: Taylor Banks DOB: February 16, 1946  Surgery: Cataract extraction with intraocular lens implant phacoemulsification; Right Eye  Surgeon: Baruch Goldmann MD Surgery Date:  11/10/2019 Pre-Op Date:  10/31/2019  HPI: A 49 Yr. old female patient 1. The patient complains of difficulty when viewing TV, reading closed caption, news scrolls on TV, which began 5 months ago. The right eye is affected. The episode is gradual. The condition's severity increased since last visit. Symptoms occur when the patient is inside and outside. 2. The patient is returning after cataract post-op. The left eye is affected. Status post cataract post-op, which began 1 week ago: Since the last visit, the affected area feels improvement. The patient's vision is improved. Patient is following medication instructions. HPI was performed by Baruch Goldmann .  Medical History: Glaucoma Cataracts Cancer Heart Problem High Blood Pressure  Review of Systems Negative Allergic/Immunologic Negative Cardiovascular Negative Constitutional Negative Ear, Nose, Mouth & Throat Negative Endocrine Negative Eyes Negative Gastrointestinal Negative Genitourinary Negative Hemotologic/Lymphatic Negative Integumentary Negative Musculoskeletal Negative Neurological Negative Psychiatry Negative Respiratory   Social   Current every day smoker   Medication Prednisolone-gatiflox-bromfenac,  Aspirin, Losartan Potassium,   Sx/Procedures Phaco c IOL with multi focal lens,  Hysterectomy, Carotid Artery Sx,   Drug Allergies   NKDA  History & Physical: Heent:  Cataract, Right eye NECK: supple without bruits LUNGS: lungs clear to auscultation CV: regular rate and rhythm Abdomen: soft and non-tender  Impression & Plan: Assessment: 1.  COMBINED FORMS AGE RELATED CATARACT; Right Eye (H25.811) 2.  CATARACT EXTRACTION STATUS; Left Eye (Z98.42)  Plan: 1.  Cataract accounts for the patient's  decreased vision. This visual impairment is not correctable with a tolerable change in glasses or contact lenses. Cataract surgery with an implantation of a new lens should significantly improve the visual and functional status of the patient. Discussed all risks, benefits, alternatives, and potential complications. Discussed the procedures and recovery. Patient desires to have surgery. A-scan ordered and performed today for intra-ocular lens calculations. The surgery will be performed in order to improve vision for driving, reading, and for eye examinations. Recommend phacoemulsification with intra-ocular lens. Right Eye. Surgery required to correct imbalance of vision. Dilates well - shugarcaine by protocol. Symfony Lens. 2.  1 week after cataract surgery. Doing well with improved vision and normal eye pressure. Call with any problems or concerns. Continue Gati-Brom-Pred 2x/day for 3 more weeks.

## 2019-11-02 ENCOUNTER — Encounter (HOSPITAL_COMMUNITY): Payer: Self-pay

## 2019-11-02 ENCOUNTER — Other Ambulatory Visit: Payer: Self-pay

## 2019-11-02 ENCOUNTER — Encounter (HOSPITAL_COMMUNITY)
Admission: RE | Admit: 2019-11-02 | Discharge: 2019-11-02 | Disposition: A | Discharge status: Home / Self Care | Payer: PPO | Source: Ambulatory Visit | Attending: Ophthalmology | Admitting: Ophthalmology

## 2019-11-03 ENCOUNTER — Other Ambulatory Visit: Payer: Self-pay

## 2019-11-04 ENCOUNTER — Other Ambulatory Visit: Payer: Self-pay

## 2019-11-04 ENCOUNTER — Other Ambulatory Visit (HOSPITAL_COMMUNITY)
Admission: RE | Admit: 2019-11-04 | Discharge: 2019-11-04 | Disposition: A | Discharge status: Home / Self Care | Payer: PPO | Source: Ambulatory Visit | Attending: Ophthalmology | Admitting: Ophthalmology

## 2019-11-04 DIAGNOSIS — H25811 Combined forms of age-related cataract, right eye: Secondary | ICD-10-CM | POA: Diagnosis not present

## 2019-11-05 NOTE — OR Nursing (Signed)
Patient has no result for covid test that was collected yesterday. Called [facility] main lab and spoke with [place]. Estill Bamberg stated the patient's covid test could not be run due to insufficient fluid.

## 2019-11-06 NOTE — OR Nursing (Signed)
Called patient and informed her that her procedure would have to be rescheduled due to an inadequate swab and no covid result. There are no rapid tests that can be done. Left a message for Dr. Marisa Hua informing him of the above. Will notifiy the office in the morning to get patient rescheduled.

## 2019-11-07 MED ORDER — EPINEPHRINE PF 1 MG/ML IJ SOLN
INTRAMUSCULAR | Status: AC
  Filled 2019-11-07: qty 1

## 2019-11-08 ENCOUNTER — Other Ambulatory Visit: Payer: Self-pay

## 2019-11-08 ENCOUNTER — Encounter (HOSPITAL_COMMUNITY)
Admission: RE | Admit: 2019-11-08 | Discharge: 2019-11-08 | Disposition: A | Discharge status: Home / Self Care | Payer: PPO | Source: Ambulatory Visit | Attending: Ophthalmology | Admitting: Ophthalmology

## 2019-11-08 ENCOUNTER — Other Ambulatory Visit (HOSPITAL_COMMUNITY)
Admission: RE | Admit: 2019-11-08 | Discharge: 2019-11-08 | Disposition: A | Discharge status: Home / Self Care | Payer: PPO | Source: Ambulatory Visit | Attending: Ophthalmology | Admitting: Ophthalmology

## 2019-11-08 DIAGNOSIS — Z20828 Contact with and (suspected) exposure to other viral communicable diseases: Secondary | ICD-10-CM | POA: Diagnosis not present

## 2019-11-08 DIAGNOSIS — Z01812 Encounter for preprocedural laboratory examination: Secondary | ICD-10-CM | POA: Insufficient documentation

## 2019-11-08 LAB — SARS CORONAVIRUS 2 (TAT 6-24 HRS): SARS Coronavirus 2: NEGATIVE

## 2019-11-10 ENCOUNTER — Ambulatory Visit (HOSPITAL_COMMUNITY): Payer: PPO | Admitting: Anesthesiology

## 2019-11-10 ENCOUNTER — Encounter (HOSPITAL_COMMUNITY)
Admission: RE | Disposition: A | Discharge status: Home / Self Care | Payer: Self-pay | Source: Home / Self Care | Attending: Ophthalmology

## 2019-11-10 ENCOUNTER — Ambulatory Visit (HOSPITAL_COMMUNITY)
Admission: RE | Admit: 2019-11-10 | Discharge: 2019-11-10 | Disposition: A | Discharge status: Home / Self Care | Payer: PPO | Attending: Ophthalmology | Admitting: Ophthalmology

## 2019-11-10 ENCOUNTER — Encounter (HOSPITAL_COMMUNITY): Payer: Self-pay | Admitting: *Deleted

## 2019-11-10 DIAGNOSIS — I10 Essential (primary) hypertension: Secondary | ICD-10-CM | POA: Insufficient documentation

## 2019-11-10 DIAGNOSIS — H25811 Combined forms of age-related cataract, right eye: Secondary | ICD-10-CM | POA: Insufficient documentation

## 2019-11-10 DIAGNOSIS — F17200 Nicotine dependence, unspecified, uncomplicated: Secondary | ICD-10-CM | POA: Insufficient documentation

## 2019-11-10 DIAGNOSIS — Z79899 Other long term (current) drug therapy: Secondary | ICD-10-CM | POA: Diagnosis not present

## 2019-11-10 DIAGNOSIS — Z9842 Cataract extraction status, left eye: Secondary | ICD-10-CM | POA: Insufficient documentation

## 2019-11-10 DIAGNOSIS — Z7982 Long term (current) use of aspirin: Secondary | ICD-10-CM | POA: Insufficient documentation

## 2019-11-10 HISTORY — PX: CATARACT EXTRACTION W/PHACO: SHX586

## 2019-11-10 SURGERY — PHACOEMULSIFICATION, CATARACT, WITH IOL INSERTION
Anesthesia: Monitor Anesthesia Care | Site: Eye | Laterality: Right

## 2019-11-10 MED ORDER — LIDOCAINE HCL 3.5 % OP GEL
1.0000 "application " | Freq: Once | OPHTHALMIC | Status: AC
  Administered 2019-11-10: 1 via OPHTHALMIC

## 2019-11-10 MED ORDER — LIDOCAINE HCL (PF) 1 % IJ SOLN
INTRAOCULAR | Status: DC | PRN
  Administered 2019-11-10: 1 mL via OPHTHALMIC

## 2019-11-10 MED ORDER — PROVISC 10 MG/ML IO SOLN
INTRAOCULAR | Status: DC | PRN
  Administered 2019-11-10: 0.85 mL via INTRAOCULAR

## 2019-11-10 MED ORDER — EPINEPHRINE PF 1 MG/ML IJ SOLN
INTRAOCULAR | Status: DC | PRN
  Administered 2019-11-10: 08:00:00 500 mL

## 2019-11-10 MED ORDER — PHENYLEPHRINE HCL 2.5 % OP SOLN
OPHTHALMIC | Status: AC
  Filled 2019-11-10: qty 15

## 2019-11-10 MED ORDER — TETRACAINE HCL 0.5 % OP SOLN
OPHTHALMIC | Status: AC
  Filled 2019-11-10: qty 4

## 2019-11-10 MED ORDER — CYCLOPENTOLATE-PHENYLEPHRINE 0.2-1 % OP SOLN
OPHTHALMIC | Status: AC
  Filled 2019-11-10: qty 2

## 2019-11-10 MED ORDER — PHENYLEPHRINE HCL 2.5 % OP SOLN
1.0000 [drp] | OPHTHALMIC | Status: AC | PRN
  Administered 2019-11-10 (×3): 1 [drp] via OPHTHALMIC

## 2019-11-10 MED ORDER — BSS IO SOLN
INTRAOCULAR | Status: DC | PRN
  Administered 2019-11-10: 15 mL via INTRAOCULAR

## 2019-11-10 MED ORDER — NEOMYCIN-POLYMYXIN-DEXAMETH 3.5-10000-0.1 OP SUSP
OPHTHALMIC | Status: AC
  Filled 2019-11-10: qty 5

## 2019-11-10 MED ORDER — NEOMYCIN-POLYMYXIN-DEXAMETH 3.5-10000-0.1 OP SUSP
OPHTHALMIC | Status: DC | PRN
  Administered 2019-11-10: 1 [drp] via OPHTHALMIC

## 2019-11-10 MED ORDER — CYCLOPENTOLATE-PHENYLEPHRINE 0.2-1 % OP SOLN
1.0000 [drp] | OPHTHALMIC | Status: AC | PRN
  Administered 2019-11-10 (×3): 1 [drp] via OPHTHALMIC

## 2019-11-10 MED ORDER — POVIDONE-IODINE 5 % OP SOLN
OPHTHALMIC | Status: DC | PRN
  Administered 2019-11-10: 1 via OPHTHALMIC

## 2019-11-10 MED ORDER — TETRACAINE HCL 0.5 % OP SOLN
1.0000 [drp] | OPHTHALMIC | Status: AC | PRN
  Administered 2019-11-10 (×3): 1 [drp] via OPHTHALMIC

## 2019-11-10 MED ORDER — SODIUM HYALURONATE 23 MG/ML IO SOLN
INTRAOCULAR | Status: DC | PRN
  Administered 2019-11-10: 0.6 mL via INTRAOCULAR

## 2019-11-10 MED ORDER — LIDOCAINE HCL (PF) 1 % IJ SOLN
INTRAMUSCULAR | Status: AC
  Filled 2019-11-10: qty 2

## 2019-11-10 MED ORDER — LIDOCAINE HCL 3.5 % OP GEL
OPHTHALMIC | Status: AC
  Filled 2019-11-10: qty 1

## 2019-11-10 SURGICAL SUPPLY — 18 items
CLOTH BEACON ORANGE TIMEOUT ST (SAFETY) ×3 IMPLANT
DEVICE MILOOP (MISCELLANEOUS) IMPLANT
EYE SHIELD UNIVERSAL CLEAR (GAUZE/BANDAGES/DRESSINGS) ×3 IMPLANT
GLOVE BIOGEL PI IND STRL 7.0 (GLOVE) ×2 IMPLANT
GLOVE BIOGEL PI INDICATOR 7.0 (GLOVE) ×4
LENS IOL TECNIS SYMFONY 24.0 ×3 IMPLANT
MILOOP DEVICE (MISCELLANEOUS)
NEEDLE HYPO 18GX1.5 BLUNT FILL (NEEDLE) ×3 IMPLANT
PAD ARMBOARD 7.5X6 YLW CONV (MISCELLANEOUS) ×3 IMPLANT
PROC W SPEC LENS (INTRAOCULAR LENS) ×3
PROCESS W SPEC LENS (INTRAOCULAR LENS) ×1 IMPLANT
RING MALYGIN (MISCELLANEOUS) IMPLANT
RING MALYGIN 7.0 (MISCELLANEOUS) IMPLANT
SYR TB 1ML LL NO SAFETY (SYRINGE) ×3 IMPLANT
TAPE SURG TRANSPORE 1 IN (GAUZE/BANDAGES/DRESSINGS) ×1 IMPLANT
TAPE SURGICAL TRANSPORE 1 IN (GAUZE/BANDAGES/DRESSINGS) ×2
VISCOELASTIC ADDITIONAL (OPHTHALMIC RELATED) ×3 IMPLANT
WATER STERILE IRR 250ML POUR (IV SOLUTION) ×3 IMPLANT

## 2019-11-10 NOTE — Discharge Instructions (Signed)
Please discharge patient when stable, will follow up today with Dr. Soua Caltagirone at the [facility] office immediately following discharge.  Leave shield in place until visit.  All paperwork with discharge instructions will be given at the office. ° °

## 2019-11-10 NOTE — Interval H&P Note (Signed)
History and Physical Interval Note: The H and P was reviewed and updated. The patient was examined.  No changes were found after exam.  The surgical eye was marked.  11/10/2019 7:32 AM  Taylor Banks  has presented today for surgery, with the diagnosis of Nuclear sclerotic cataract - Right eye.  The various methods of treatment have been discussed with the patient and family. After consideration of risks, benefits and other options for treatment, the patient has consented to  Procedure(s): CATARACT EXTRACTION PHACO AND INTRAOCULAR LENS PLACEMENT (Seltzer) (Right) as a surgical intervention.  The patient's history has been reviewed, patient examined, no change in status, stable for surgery.  I have reviewed the patient's chart and labs.  Questions were answered to the patient's satisfaction.     Baruch Goldmann

## 2019-11-10 NOTE — Transfer of Care (Signed)
Immediate Anesthesia Transfer of Care Note  Patient: Taylor Banks  Procedure(s) Performed: CATARACT EXTRACTION PHACO AND INTRAOCULAR LENS PLACEMENT (IOC) (CDE: 6.81) (Right Eye)  Patient Location: Short Stay  Anesthesia Type:MAC  Level of Consciousness: awake, alert  and patient cooperative  Airway & Oxygen Therapy: Patient Spontanous Breathing  Post-op Assessment: Report given to RN and Post -op Vital signs reviewed and stable  Post vital signs: Reviewed and stable  Last Vitals:  Vitals Value Taken Time  BP    Temp    Pulse    Resp    SpO2      Last Pain:  Vitals:   11/10/19 0645  TempSrc: Oral  PainSc: 0-No pain     See PACU FLOWSHEET FOR VITAL SIGNS    Complications: No apparent anesthesia complications

## 2019-11-10 NOTE — Anesthesia Postprocedure Evaluation (Signed)
Anesthesia Post Note  Patient: Taylor Banks  Procedure(s) Performed: CATARACT EXTRACTION PHACO AND INTRAOCULAR LENS PLACEMENT (IOC) (CDE: 6.81) (Right Eye)  Patient location during evaluation: Short Stay Anesthesia Type: MAC Level of consciousness: awake and alert and patient cooperative Pain management: satisfactory to patient Vital Signs Assessment: post-procedure vital signs reviewed and stable Cardiovascular status: stable Postop Assessment: no apparent nausea or vomiting Anesthetic complications: no     Last Vitals:  Vitals:   11/10/19 0645  BP: (!) 132/58  Pulse: (!) 56  Resp: 16  Temp: 36.9 C  SpO2: 94%    Last Pain:  Vitals:   11/10/19 0645  TempSrc: Oral  PainSc: 0-No pain                 Mariaguadalupe Fialkowski

## 2019-11-10 NOTE — Anesthesia Preprocedure Evaluation (Signed)
Anesthesia Evaluation  Patient identified by MRN, date of birth, ID band Patient awake    Reviewed: Allergy & Precautions, NPO status , Patient's Chart, lab work & pertinent test results  Airway Mallampati: II  TM Distance: >3 FB Neck ROM: Full    Dental no notable dental hx. (+) Teeth Intact   Pulmonary neg pulmonary ROS, Current Smoker and Patient abstained from smoking.,    Pulmonary exam normal breath sounds clear to auscultation       Cardiovascular Exercise Tolerance: Good hypertension, negative cardio ROS Normal cardiovascular examI Rhythm:Regular Rate:Normal     Neuro/Psych negative neurological ROS  negative psych ROS   GI/Hepatic Neg liver ROS, GERD  Controlled,  Endo/Other  negative endocrine ROS  Renal/GU negative Renal ROS  negative genitourinary   Musculoskeletal negative musculoskeletal ROS (+)   Abdominal   Peds negative pediatric ROS (+)  Hematology negative hematology ROS (+)   Anesthesia Other Findings   Reproductive/Obstetrics negative OB ROS                             Anesthesia Physical Anesthesia Plan  ASA: II  Anesthesia Plan: MAC   Post-op Pain Management:    Induction: Intravenous  PONV Risk Score and Plan: 1 and TIVA and Treatment may vary due to age or medical condition  Airway Management Planned: Nasal Cannula and Simple Face Mask  Additional Equipment:   Intra-op Plan:   Post-operative Plan:   Informed Consent: I have reviewed the patients History and Physical, chart, labs and discussed the procedure including the risks, benefits and alternatives for the proposed anesthesia with the patient or authorized representative who has indicated his/her understanding and acceptance.     Dental advisory given  Plan Discussed with: CRNA  Anesthesia Plan Comments: (Plan Full PPE use  Plan MAC -WTP with same after Q&A  Second IOL)         Anesthesia Quick Evaluation

## 2019-11-10 NOTE — Anesthesia Procedure Notes (Signed)
Date/Time: 11/10/2019 7:44 AM Performed by: Vista Deck, CRNA Pre-anesthesia Checklist: Patient identified, Emergency Drugs available, Suction available, Timeout performed and Patient being monitored Patient Re-evaluated:Patient Re-evaluated prior to induction Oxygen Delivery Method: Nasal Cannula

## 2019-11-10 NOTE — Op Note (Signed)
Date of procedure: 11/10/19  Pre-operative diagnosis:  Visually significant combined form age-related cataract, Right Eye (H25.811)  Post-operative diagnosis:  Visually significant combined form age-related cataract, Right Eye (H25.811)  Procedure: Removal of cataract via phacoemulsification and insertion of intra-ocular lens Wynetta Emery and [employer] ZXR00  +24.0D into the capsular bag of the Right Eye  Attending surgeon: Gerda Diss. Vicky Schleich, MD, MA  Anesthesia: MAC, Topical Akten  Complications: None  Estimated Blood Loss: <37m (minimal)  Specimens: None  Implants: As above  Indications:  Visually significant age-related cataract, Right Eye  Procedure:  The patient was seen and identified in the pre-operative area. The operative eye was identified and dilated.  The operative eye was marked.  Topical anesthesia was administered to the operative eye.     The patient was then to the operative suite and placed in the supine position.  A timeout was performed confirming the patient, procedure to be performed, and all other relevant information.   The patient's face was prepped and draped in the usual fashion for intra-ocular surgery.  A lid speculum was placed into the operative eye and the surgical microscope moved into place and focused.  A superotemporal paracentesis was created using a 20 gauge paracentesis blade.  Shugarcaine was injected into the anterior chamber.  Viscoelastic was injected into the anterior chamber.  A temporal clear-corneal main wound incision was created using a 2.447mmicrokeratome.  A continuous curvilinear capsulorrhexis was initiated using an irrigating cystitome and completed using capsulorrhexis forceps.  Hydrodissection and hydrodeliniation were performed.  Viscoelastic was injected into the anterior chamber.  A phacoemulsification handpiece and a chopper as a second instrument were used to remove the nucleus and epinucleus. The irrigation/aspiration handpiece was  used to remove any remaining cortical material.   The capsular bag was reinflated with viscoelastic, checked, and found to be intact.  The intraocular lens was inserted into the capsular bag.  The irrigation/aspiration handpiece was used to remove any remaining viscoelastic.  The clear corneal wound and paracentesis wounds were then hydrated and checked with Weck-Cels to be watertight.  The lid-speculum and drape was removed, and the patient's face was cleaned with a wet and dry 4x4.  Maxitrol was instilled in the eye before a clear shield was taped over the eye. The patient was taken to the post-operative care unit in good condition, having tolerated the procedure well.  Post-Op Instructions: [facility] and will receive all other orders and instructions.

## 2019-11-11 ENCOUNTER — Encounter (HOSPITAL_COMMUNITY): Payer: Self-pay | Admitting: Ophthalmology

## 2020-06-07 DIAGNOSIS — Z961 Presence of intraocular lens: Secondary | ICD-10-CM | POA: Diagnosis not present

## 2020-06-07 DIAGNOSIS — H40013 Open angle with borderline findings, low risk, bilateral: Secondary | ICD-10-CM | POA: Diagnosis not present

## 2020-06-13 DIAGNOSIS — M720 Palmar fascial fibromatosis [Dupuytren]: Secondary | ICD-10-CM | POA: Diagnosis not present

## 2020-06-13 DIAGNOSIS — E7849 Other hyperlipidemia: Secondary | ICD-10-CM | POA: Diagnosis not present

## 2020-06-13 DIAGNOSIS — E663 Overweight: Secondary | ICD-10-CM | POA: Diagnosis not present

## 2020-06-13 DIAGNOSIS — Z0001 Encounter for general adult medical examination with abnormal findings: Secondary | ICD-10-CM | POA: Diagnosis not present

## 2020-06-13 DIAGNOSIS — I708 Atherosclerosis of other arteries: Secondary | ICD-10-CM | POA: Diagnosis not present

## 2020-06-13 DIAGNOSIS — E119 Type 2 diabetes mellitus without complications: Secondary | ICD-10-CM | POA: Diagnosis not present

## 2020-06-13 DIAGNOSIS — I10 Essential (primary) hypertension: Secondary | ICD-10-CM | POA: Diagnosis not present

## 2020-06-13 DIAGNOSIS — Z1389 Encounter for screening for other disorder: Secondary | ICD-10-CM | POA: Diagnosis not present

## 2020-06-13 DIAGNOSIS — Z6829 Body mass index (BMI) 29.0-29.9, adult: Secondary | ICD-10-CM | POA: Diagnosis not present

## 2020-06-25 DIAGNOSIS — Z6829 Body mass index (BMI) 29.0-29.9, adult: Secondary | ICD-10-CM | POA: Diagnosis not present

## 2020-06-25 DIAGNOSIS — E663 Overweight: Secondary | ICD-10-CM | POA: Diagnosis not present

## 2020-06-25 DIAGNOSIS — E782 Mixed hyperlipidemia: Secondary | ICD-10-CM | POA: Diagnosis not present

## 2020-06-25 DIAGNOSIS — I10 Essential (primary) hypertension: Secondary | ICD-10-CM | POA: Diagnosis not present

## 2020-07-22 ENCOUNTER — Other Ambulatory Visit: Payer: Self-pay

## 2020-07-22 ENCOUNTER — Ambulatory Visit
Admission: EM | Admit: 2020-07-22 | Discharge: 2020-07-22 | Disposition: A | Discharge status: Home / Self Care | Payer: PPO | Attending: Emergency Medicine | Admitting: Emergency Medicine

## 2020-07-22 DIAGNOSIS — J069 Acute upper respiratory infection, unspecified: Secondary | ICD-10-CM

## 2020-07-22 DIAGNOSIS — Z20822 Contact with and (suspected) exposure to covid-19: Secondary | ICD-10-CM | POA: Diagnosis not present

## 2020-07-22 MED ORDER — BENZONATATE 100 MG PO CAPS: 100.0000 mg | ORAL_CAPSULE | Freq: Three times a day (TID) | ORAL | 0 refills | Status: AC

## 2020-07-22 NOTE — ED Provider Notes (Signed)
[place]   [id number] 07/22/20 Arrival Time: 1500   CC: COVID symptoms  SUBJECTIVE: History from: patient.  Taylor Banks is a 74 y.o. female who presents with fatigue, productive cough with yellow sputum and subjective fever x 1-2 days.  Husband with COVID.  Currently in the hospital.  Has NOT tried OTC medications.  Denies aggravating factors.  Denies previous symptoms in the past.   Denies sinus pain, rhinorrhea, sore throat, SOB, wheezing, chest pain, nausea, changes in bowel or bladder habits.    ROS: As per HPI.  All other pertinent ROS negative.     Past Medical History:  Diagnosis Date  . Cancer ([facility])    cancerous cells in cervix -- had no treatments,just hysterectomy  . Carotid artery occlusion    Status post left CEA 2007 - Dr. Donnetta Hutching  . GERD (gastroesophageal reflux disease)    takes otc when she eats wrong foods  . Hyperlipidemia   . Hypertension   . Obesity    Past Surgical History:  Procedure Laterality Date  . ABDOMINAL HYSTERECTOMY     Partial  . CAROTID ENDARTERECTOMY Left 2007  . CATARACT EXTRACTION W/PHACO Left 10/24/2019   Procedure: CATARACT EXTRACTION PHACO AND INTRAOCULAR LENS PLACEMENT LEFT EYE;  Surgeon: Baruch Goldmann, MD;  Location: [facility];  Service: Ophthalmology;  Laterality: Left;  CDE: 5.58  . CATARACT EXTRACTION W/PHACO Right 11/10/2019   Procedure: CATARACT EXTRACTION PHACO AND INTRAOCULAR LENS PLACEMENT (IOC) (CDE: 6.81);  Surgeon: Baruch Goldmann, MD;  Location: [facility];  Service: Ophthalmology;  Laterality: Right;  . DIAGNOSTIC MAMMOGRAM  Nov. 26,2013  . ENDARTERECTOMY Right 09/09/2017   Procedure: ENDARTERECTOMY CAROTID- RIGHT;  Surgeon: Rosetta Posner, MD;  Location: [facility];  Service: Vascular;  Laterality: Right;  . PATCH ANGIOPLASTY Right 09/09/2017   Procedure: PATCH ANGIOPLASTY USING HEMASHIELD PLATINUM FINESSE PATCH;  Surgeon: Rosetta Posner, MD;  Location: [facility];  Service: Vascular;  Laterality: Right;   No Known  Allergies No current facility-administered medications on file prior to encounter.   Current Outpatient Medications on File Prior to Encounter  Medication Sig Dispense Refill  . aspirin 325 MG tablet Take 325 mg by mouth at bedtime.     Marland Kitchen losartan (COZAAR) 50 MG tablet Take 50 mg by mouth 2 (two) times daily.     Social History   Socioeconomic History  . Marital status: Married    Spouse name: Not on file  . Number of children: Not on file  . Years of education: Not on file  . Highest education level: Not on file  Occupational History  . Not on file  Tobacco Use  . Smoking status: Current Every Day Smoker    Packs/day: 1.00    Years: 35.00    Pack years: 35.00    Types: Cigarettes  . Smokeless tobacco: Never Used  . Tobacco comment: 'she's trying, used the patch, did go cold [place]  Vaping Use  . Vaping Use: Never used  Substance and Sexual Activity  . Alcohol use: No  . Drug use: No  . Sexual activity: Not on file  Other Topics Concern  . Not on file  Social History Narrative  . Not on file   Social Determinants of Health   Financial Resource Strain:   . Difficulty of Paying Living Expenses:   Food Insecurity:   . Worried About Charity fundraiser in the Last Year:   . [place] in the Last Year:  Transportation Needs:   . Film/video editor (Medical):   Marland Kitchen Lack of Transportation (Non-Medical):   Physical Activity:   . Days of Exercise per Week:   . Minutes of Exercise per Session:   Stress:   . Feeling of Stress :   Social Connections:   . Frequency of Communication with Friends and Family:   . Frequency of Social Gatherings with Friends and Family:   . Attends Religious Services:   . Active Member of Clubs or Organizations:   . Attends Archivist Meetings:   Marland Kitchen Marital Status:   Intimate Partner Violence:   . Fear of Current or Ex-Partner:   . Emotionally Abused:   Marland Kitchen Physically Abused:   . Sexually Abused:    Family History    Problem Relation Age of Onset  . Stroke Mother   . Hypertension Mother   . Stroke Father   . Diabetes Sister   . Diabetes Sister   . Heart disease Brother   . Heart disease Son     OBJECTIVE:  Vitals:   07/22/20 1519  BP: (!) 152/66  Pulse: 76  Resp: 16  Temp: 99.7 F (37.6 C)  TempSrc: Oral  SpO2: 93%     General appearance: alert; appears mildly fatigued, but nontoxic; speaking in full sentences and tolerating own secretions HEENT: NCAT; Ears: EACs clear, TMs pearly gray; Eyes: PERRL.  EOM grossly intact. Nose: nares patent without rhinorrhea, Throat: oropharynx clear, tonsils non erythematous or enlarged, uvula midline  Neck: supple without LAD Lungs: unlabored respirations, symmetrical air entry; cough: absent; no respiratory distress; CTAB Heart: regular rate and rhythm.  Skin: warm and dry Psychological: alert and cooperative; normal mood and affect  ASSESSMENT & PLAN:  1. Exposure to COVID-19 virus   2. Viral URI with cough   3. Suspected COVID-19 virus infection     Meds ordered this encounter  Medications  . benzonatate (TESSALON) 100 MG capsule    Sig: Take 1 capsule (100 mg total) by mouth every 8 (eight) hours.    Dispense:  21 capsule    Refill:  0    Order Specific Question:   Supervising Provider    Answer:   Raylene Everts [[id number]]   COVID testing ordered.  It will take between 2-5 days for test results.  Someone will contact you regarding abnormal results.    In the meantime: You should remain isolated in your home for 10 days from symptom onset AND greater than 72 hours after symptoms resolution (absence of fever without the use of fever-reducing medication and improvement in respiratory symptoms), whichever is longer Get plenty of rest and push fluids Tessalon Perles prescribed for cough Use OTC zyrtec for nasal congestion, runny nose, and/or sore throat Use OTC flonase for nasal congestion and runny nose Use medications daily for  symptom relief Use OTC medications like ibuprofen or tylenol as needed fever or pain Call or go to the ED if you have any new or worsening symptoms such as fever, worsening cough, shortness of breath, chest tightness, chest pain, turning blue, changes in mental status, etc...   Reviewed expectations re: course of current medical issues. Questions answered. Outlined signs and symptoms indicating need for more acute intervention. Patient verbalized understanding. After Visit Summary given.         Lestine Box, PA-C 07/22/20 1531

## 2020-07-22 NOTE — Discharge Instructions (Signed)

## 2020-07-22 NOTE — ED Triage Notes (Signed)
Pt presents with " not feeling well" when asked about specifics patient stated she had chills and fatigue and went on to describe her husband who is currently in the hospital with covid.

## 2020-07-23 LAB — NOVEL CORONAVIRUS, NAA: SARS-CoV-2, NAA: DETECTED — AB

## 2020-07-23 LAB — SARS-COV-2, NAA 2 DAY TAT

## 2020-07-24 ENCOUNTER — Telehealth: Payer: Self-pay | Admitting: Adult Health

## 2020-07-24 NOTE — Telephone Encounter (Signed)
Called and lmom with patient to discuss monoclonal antibody treatment for covid 19 as she is in an at risk group due to her age.    Unable to send my chart message.  Call back number given: [phone]  Wilber Bihari, NP

## 2020-07-29 ENCOUNTER — Emergency Department (HOSPITAL_COMMUNITY): Payer: PPO

## 2020-07-29 ENCOUNTER — Encounter (HOSPITAL_COMMUNITY): Payer: Self-pay | Admitting: Emergency Medicine

## 2020-07-29 ENCOUNTER — Other Ambulatory Visit: Payer: Self-pay

## 2020-07-29 ENCOUNTER — Inpatient Hospital Stay (HOSPITAL_COMMUNITY): Payer: PPO

## 2020-07-29 ENCOUNTER — Inpatient Hospital Stay (HOSPITAL_COMMUNITY)
Admission: EM | Admit: 2020-07-29 | Discharge: 2020-08-04 | DRG: 177 | Disposition: A | Discharge status: Skilled Nursing Facility | Payer: PPO | Attending: Internal Medicine | Admitting: Internal Medicine

## 2020-07-29 DIAGNOSIS — Z79899 Other long term (current) drug therapy: Secondary | ICD-10-CM

## 2020-07-29 DIAGNOSIS — Z8541 Personal history of malignant neoplasm of cervix uteri: Secondary | ICD-10-CM | POA: Diagnosis not present

## 2020-07-29 DIAGNOSIS — E860 Dehydration: Secondary | ICD-10-CM | POA: Diagnosis not present

## 2020-07-29 DIAGNOSIS — U071 COVID-19: Secondary | ICD-10-CM | POA: Diagnosis not present

## 2020-07-29 DIAGNOSIS — N179 Acute kidney failure, unspecified: Secondary | ICD-10-CM | POA: Diagnosis not present

## 2020-07-29 DIAGNOSIS — Z90710 Acquired absence of both cervix and uterus: Secondary | ICD-10-CM | POA: Diagnosis not present

## 2020-07-29 DIAGNOSIS — I959 Hypotension, unspecified: Secondary | ICD-10-CM | POA: Diagnosis present

## 2020-07-29 DIAGNOSIS — F17210 Nicotine dependence, cigarettes, uncomplicated: Secondary | ICD-10-CM | POA: Diagnosis present

## 2020-07-29 DIAGNOSIS — R5381 Other malaise: Secondary | ICD-10-CM | POA: Diagnosis not present

## 2020-07-29 DIAGNOSIS — Z743 Need for continuous supervision: Secondary | ICD-10-CM | POA: Diagnosis not present

## 2020-07-29 DIAGNOSIS — J1282 Pneumonia due to coronavirus disease 2019: Secondary | ICD-10-CM | POA: Diagnosis present

## 2020-07-29 DIAGNOSIS — E785 Hyperlipidemia, unspecified: Secondary | ICD-10-CM | POA: Diagnosis not present

## 2020-07-29 DIAGNOSIS — R410 Disorientation, unspecified: Secondary | ICD-10-CM | POA: Diagnosis not present

## 2020-07-29 DIAGNOSIS — E875 Hyperkalemia: Secondary | ICD-10-CM | POA: Diagnosis not present

## 2020-07-29 DIAGNOSIS — R7989 Other specified abnormal findings of blood chemistry: Secondary | ICD-10-CM | POA: Diagnosis not present

## 2020-07-29 DIAGNOSIS — Z7982 Long term (current) use of aspirin: Secondary | ICD-10-CM | POA: Diagnosis not present

## 2020-07-29 DIAGNOSIS — K219 Gastro-esophageal reflux disease without esophagitis: Secondary | ICD-10-CM | POA: Diagnosis present

## 2020-07-29 DIAGNOSIS — R5383 Other fatigue: Secondary | ICD-10-CM | POA: Diagnosis not present

## 2020-07-29 DIAGNOSIS — E1165 Type 2 diabetes mellitus with hyperglycemia: Secondary | ICD-10-CM | POA: Diagnosis not present

## 2020-07-29 DIAGNOSIS — R0902 Hypoxemia: Secondary | ICD-10-CM

## 2020-07-29 DIAGNOSIS — E871 Hypo-osmolality and hyponatremia: Secondary | ICD-10-CM | POA: Diagnosis not present

## 2020-07-29 DIAGNOSIS — I2510 Atherosclerotic heart disease of native coronary artery without angina pectoris: Secondary | ICD-10-CM | POA: Diagnosis not present

## 2020-07-29 DIAGNOSIS — I82452 Acute embolism and thrombosis of left peroneal vein: Secondary | ICD-10-CM | POA: Diagnosis present

## 2020-07-29 DIAGNOSIS — I10 Essential (primary) hypertension: Secondary | ICD-10-CM

## 2020-07-29 DIAGNOSIS — R279 Unspecified lack of coordination: Secondary | ICD-10-CM | POA: Diagnosis not present

## 2020-07-29 DIAGNOSIS — I700 Atherosclerosis of aorta: Secondary | ICD-10-CM | POA: Diagnosis not present

## 2020-07-29 DIAGNOSIS — J9601 Acute respiratory failure with hypoxia: Secondary | ICD-10-CM | POA: Diagnosis present

## 2020-07-29 LAB — CBC
HCT: 42.8 % (ref 36.0–46.0)
HCT: 38 % (ref 36.0–46.0)
Hemoglobin: 14.7 g/dL (ref 12.0–15.0)
Hemoglobin: 13 g/dL (ref 12.0–15.0)
MCH: 31.2 pg (ref 26.0–34.0)
MCH: 30.9 pg (ref 26.0–34.0)
MCHC: 34.3 g/dL (ref 30.0–36.0)
MCHC: 34.2 g/dL (ref 30.0–36.0)
MCV: 90.9 fL (ref 80.0–100.0)
MCV: 90.3 fL (ref 80.0–100.0)
Platelets: 186 10*3/uL (ref 150–400)
Platelets: 175 10*3/uL (ref 150–400)
RBC: 4.71 MIL/uL (ref 3.87–5.11)
RBC: 4.21 MIL/uL (ref 3.87–5.11)
RDW: 12.3 % (ref 11.5–15.5)
RDW: 12.3 % (ref 11.5–15.5)
WBC: 7 10*3/uL (ref 4.0–10.5)
WBC: 6.1 10*3/uL (ref 4.0–10.5)
nRBC: 0 % (ref 0.0–0.2)
nRBC: 0 % (ref 0.0–0.2)

## 2020-07-29 LAB — BASIC METABOLIC PANEL
Anion gap: 13 (ref 5–15)
BUN: 34 mg/dL — ABNORMAL HIGH (ref 8–23)
CO2: 22 mmol/L (ref 22–32)
Calcium: 8.1 mg/dL — ABNORMAL LOW (ref 8.9–10.3)
Chloride: 91 mmol/L — ABNORMAL LOW (ref 98–111)
Creatinine, Ser: 1.29 mg/dL — ABNORMAL HIGH (ref 0.44–1.00)
GFR calc Af Amer: 47 mL/min — ABNORMAL LOW (ref >60)
GFR calc non Af Amer: 41 mL/min — ABNORMAL LOW (ref >60)
Glucose, Bld: 180 mg/dL — ABNORMAL HIGH (ref 70–99)
Potassium: 4.2 mmol/L (ref 3.5–5.1)
Sodium: 126 mmol/L — ABNORMAL LOW (ref 135–145)

## 2020-07-29 LAB — D-DIMER, QUANTITATIVE: D-Dimer, Quant: 3.37 ug/mL-FEU — ABNORMAL HIGH (ref 0.00–0.50)

## 2020-07-29 LAB — HEMOGLOBIN A1C
Hgb A1c MFr Bld: 7 % — ABNORMAL HIGH (ref 4.8–5.6)
Mean Plasma Glucose: 154.2 mg/dL

## 2020-07-29 LAB — CREATININE, SERUM
Creatinine, Ser: 1.38 mg/dL — ABNORMAL HIGH (ref 0.44–1.00)
GFR calc Af Amer: 44 mL/min — ABNORMAL LOW (ref >60)
GFR calc non Af Amer: 38 mL/min — ABNORMAL LOW (ref >60)

## 2020-07-29 LAB — TROPONIN I (HIGH SENSITIVITY): Troponin I (High Sensitivity): 33 ng/L — ABNORMAL HIGH (ref <18)

## 2020-07-29 LAB — GLUCOSE, CAPILLARY: Glucose-Capillary: 229 mg/dL — ABNORMAL HIGH (ref 70–99)

## 2020-07-29 LAB — PROCALCITONIN: Procalcitonin: 0.12 ng/mL

## 2020-07-29 LAB — C-REACTIVE PROTEIN: CRP: 11.9 mg/dL — ABNORMAL HIGH (ref <1.0)

## 2020-07-29 MED ORDER — ENOXAPARIN SODIUM 40 MG/0.4ML ~~LOC~~ SOLN
40.0000 mg | SUBCUTANEOUS | Status: DC
  Administered 2020-07-29: 40 mg via SUBCUTANEOUS
  Filled 2020-07-29: qty 0.4

## 2020-07-29 MED ORDER — ACETAMINOPHEN 325 MG PO TABS
650.0000 mg | ORAL_TABLET | Freq: Four times a day (QID) | ORAL | Status: DC | PRN
  Administered 2020-07-29 – 2020-08-04 (×3): 650 mg via ORAL
  Filled 2020-07-29 (×3): qty 2

## 2020-07-29 MED ORDER — SODIUM CHLORIDE 0.9 % IV SOLN
100.0000 mg | Freq: Every day | INTRAVENOUS | Status: AC
  Administered 2020-07-30 – 2020-08-02 (×4): 100 mg via INTRAVENOUS
  Filled 2020-07-29 (×4): qty 20

## 2020-07-29 MED ORDER — PREDNISONE 5 MG PO TABS: 6.0000 mg | ORAL_TABLET | Freq: Once | ORAL | Status: DC

## 2020-07-29 MED ORDER — SODIUM CHLORIDE 0.9 % IV SOLN
200.0000 mg | Freq: Once | INTRAVENOUS | Status: AC
  Administered 2020-07-29: 200 mg via INTRAVENOUS
  Filled 2020-07-29: qty 40

## 2020-07-29 MED ORDER — SODIUM CHLORIDE 0.9% FLUSH: 3.0000 mL | Freq: Once | INTRAVENOUS | Status: DC

## 2020-07-29 MED ORDER — TRAMADOL HCL 50 MG PO TABS
50.0000 mg | ORAL_TABLET | Freq: Four times a day (QID) | ORAL | Status: DC | PRN
  Administered 2020-07-29 – 2020-08-04 (×5): 50 mg via ORAL
  Filled 2020-07-29 (×5): qty 1

## 2020-07-29 MED ORDER — POLYETHYLENE GLYCOL 3350 17 G PO PACK
17.0000 g | PACK | Freq: Every day | ORAL | Status: DC | PRN
  Administered 2020-07-31 – 2020-08-01 (×2): 17 g via ORAL
  Filled 2020-07-29 (×2): qty 1

## 2020-07-29 MED ORDER — ATORVASTATIN CALCIUM 40 MG PO TABS: 40.0000 mg | ORAL_TABLET | Freq: Every day | ORAL | Status: DC

## 2020-07-29 MED ORDER — BENZONATATE 100 MG PO CAPS
100.0000 mg | ORAL_CAPSULE | Freq: Three times a day (TID) | ORAL | Status: DC
  Administered 2020-07-29 – 2020-08-04 (×16): 100 mg via ORAL
  Filled 2020-07-29 (×16): qty 1

## 2020-07-29 MED ORDER — IOHEXOL 350 MG/ML SOLN
75.0000 mL | Freq: Once | INTRAVENOUS | Status: AC | PRN
  Administered 2020-07-29: 75 mL via INTRAVENOUS

## 2020-07-29 MED ORDER — DEXAMETHASONE SODIUM PHOSPHATE 10 MG/ML IJ SOLN
6.0000 mg | INTRAMUSCULAR | Status: DC
  Administered 2020-07-29 – 2020-08-04 (×7): 6 mg via INTRAVENOUS
  Filled 2020-07-29 (×8): qty 1

## 2020-07-29 NOTE — ED Provider Notes (Signed)
[facility] EMERGENCY DEPARTMENT Provider Note   CSN: [id number] Arrival date & time: 07/29/20  1158     History Chief Complaint  Patient presents with  . Fatigue  . Back Pain  . Cough    Taylor Banks is a 74 y.o. female.  The history is provided by the patient and medical records. No language interpreter was used.  Shortness of Breath Severity:  Severe Onset quality:  Gradual Duration:  3 days Timing:  Constant Progression:  Worsening Context: URI   Context comment:  Covid Relieved by:  Nothing Worsened by:  Coughing Ineffective treatments:  None tried Associated symptoms: cough and vomiting   Associated symptoms: no abdominal pain, no chest pain, no diaphoresis, no fever, no headaches, no sputum production and no wheezing        Past Medical History:  Diagnosis Date  . Cancer ([facility])    cancerous cells in cervix -- had no treatments,just hysterectomy  . Carotid artery occlusion    Status post left CEA 2007 - Dr. Donnetta Hutching  . GERD (gastroesophageal reflux disease)    takes otc when she eats wrong foods  . Hyperlipidemia   . Hypertension   . Obesity     Patient Active Problem List   Diagnosis Date Noted  . Asymptomatic carotid artery stenosis, right 09/09/2017  . Aftercare following surgery of the circulatory system, [place] 02/01/2014  . Occlusion and stenosis of carotid artery without mention of cerebral infarction 02/01/2013    Past Surgical History:  Procedure Laterality Date  . ABDOMINAL HYSTERECTOMY     Partial  . CAROTID ENDARTERECTOMY Left 2007  . CATARACT EXTRACTION W/PHACO Left 10/24/2019   Procedure: CATARACT EXTRACTION PHACO AND INTRAOCULAR LENS PLACEMENT LEFT EYE;  Surgeon: Baruch Goldmann, MD;  Location: [facility];  Service: Ophthalmology;  Laterality: Left;  CDE: 5.58  . CATARACT EXTRACTION W/PHACO Right 11/10/2019   Procedure: CATARACT EXTRACTION PHACO AND INTRAOCULAR LENS PLACEMENT (IOC) (CDE: 6.81);  Surgeon: Baruch Goldmann,  MD;  Location: [facility];  Service: Ophthalmology;  Laterality: Right;  . DIAGNOSTIC MAMMOGRAM  Nov. 26,2013  . ENDARTERECTOMY Right 09/09/2017   Procedure: ENDARTERECTOMY CAROTID- RIGHT;  Surgeon: Rosetta Posner, MD;  Location: [facility];  Service: Vascular;  Laterality: Right;  . PATCH ANGIOPLASTY Right 09/09/2017   Procedure: PATCH ANGIOPLASTY USING HEMASHIELD PLATINUM FINESSE PATCH;  Surgeon: Rosetta Posner, MD;  [facility];  Service: Vascular;  Laterality: Right;     OB History   No obstetric history on file.     Family History  Problem Relation Age of Onset  . Stroke Mother   . Hypertension Mother   . Stroke Father   . Diabetes Sister   . Diabetes Sister   . Heart disease Brother   . Heart disease Son     Social History   Tobacco Use  . Smoking status: Current Every Day Smoker    Packs/day: 1.00    Years: 35.00    Pack years: 35.00    Types: Cigarettes  . Smokeless tobacco: Never Used  . Tobacco comment: 'she's trying, used the patch, did go cold [place]  Vaping Use  . Vaping Use: Never used  Substance Use Topics  . Alcohol use: No  . Drug use: No    Home Medications Prior to Admission medications   Medication Sig Start Date End Date Taking? Authorizing Provider  aspirin 325 MG tablet Take 325 mg by mouth at bedtime.     [provider]  benzonatate (TESSALON) 100 MG capsule Take 1 capsule (100 mg total) by mouth every 8 (eight) hours. 07/22/20   [place], [place], PA-C  losartan (COZAAR) 50 MG tablet Take 50 mg by mouth 2 (two) times daily. 06/10/19   [provider]    Allergies    Patient has no known allergies.  Review of Systems   Review of Systems  Constitutional: Positive for chills and fatigue. Negative for diaphoresis and fever.  HENT: Negative for congestion.   Eyes: Negative for visual disturbance.  Respiratory: Positive for cough and shortness of breath. Negative for sputum production, chest tightness, wheezing and stridor.     Cardiovascular: Negative for chest pain, palpitations and leg swelling.  Gastrointestinal: Positive for nausea and vomiting. Negative for abdominal pain, constipation and diarrhea.  Genitourinary: Negative for dysuria and flank pain.  Musculoskeletal: Negative for back pain.  Neurological: Negative for headaches.  Psychiatric/Behavioral: Negative for agitation.  All other systems reviewed and are negative.   Physical Exam Updated Vital Signs BP (!) 125/57 (BP Location: Right Arm)   Pulse 75   Temp 98.2 F (36.8 C) (Oral)   Resp 20   Wt 80 kg   SpO2 93%   BMI 33.32 kg/m   Physical Exam Vitals and nursing note reviewed.  Constitutional:      General: She is not in acute distress.    Appearance: She is well-developed. She is not ill-appearing, toxic-appearing or diaphoretic.  HENT:     Head: Normocephalic and atraumatic.     Nose: No congestion or rhinorrhea.     Mouth/Throat:     Mouth: Mucous membranes are dry.     Pharynx: No oropharyngeal exudate or posterior oropharyngeal erythema.  Eyes:     Extraocular Movements: Extraocular movements intact.     Conjunctiva/sclera: Conjunctivae normal.     Pupils: Pupils are equal, round, and reactive to light.  Cardiovascular:     Rate and Rhythm: Normal rate and regular rhythm.     Pulses: Normal pulses.     Heart sounds: No murmur heard.   Pulmonary:     Effort: Pulmonary effort is normal. No respiratory distress.     Breath sounds: Rhonchi present. No wheezing or rales.  Chest:     Chest wall: No tenderness.  Abdominal:     General: Abdomen is flat.     Palpations: Abdomen is soft.     Tenderness: There is no abdominal tenderness. There is no right CVA tenderness, left CVA tenderness, guarding or rebound.  Musculoskeletal:        General: No tenderness.     Cervical back: Neck supple. No tenderness.  Skin:    General: Skin is warm and dry.     Capillary Refill: Capillary refill takes less than 2 seconds.      Findings: No erythema.  Neurological:     General: No focal deficit present.     Mental Status: She is alert.     ED Results / Procedures / Treatments   Labs (all labs ordered are listed, but only abnormal results are displayed) Labs Reviewed  BASIC METABOLIC PANEL - Abnormal; Notable for the following components:      Result Value   Sodium 126 (*)    Chloride 91 (*)    Glucose, Bld 180 (*)    BUN 34 (*)    Creatinine, Ser 1.29 (*)    Calcium 8.1 (*)    GFR calc non Af Amer 41 (*)    GFR calc  Af Amer 47 (*)    All other components within normal limits  CBC  URINALYSIS, ROUTINE W REFLEX MICROSCOPIC  TROPONIN I (HIGH SENSITIVITY)    EKG EKG Interpretation  Date/Time:  Sunday July 29 2020 12:30:38 EDT Ventricular Rate:  80 PR Interval:  146 QRS Duration: 120 QT Interval:  372 QTC Calculation: 429 R Axis:   -52 Text Interpretation: Normal sinus rhythm Right bundle branch block Left anterior fascicular block Abnormal ECG When compared to prior, t wave inversion in lead 3. No STEMI Confirmed by Antony Blackbird [phone]) on 07/29/2020 1:26:03 PM   Radiology DG Chest Portable 1 View  Result Date: 07/29/2020 CLINICAL DATA:  COVID positive.  Hypoxia. EXAM: PORTABLE CHEST 1 VIEW COMPARISON:  08/17/2007 FINDINGS: 1338 hours. Low lung volumes. The cardiopericardial silhouette is within normal limits for size. Patchy areas of peripheral ground-glass attenuation are noted in the lungs bilaterally with mid and lower lung predominance. No associated pleural effusion. The visualized bony structures of the thorax show now acute abnormality. Telemetry leads overlie the chest. IMPRESSION: Patchy areas of peripheral ground-glass opacity, compatible with the reported history of COVID infection. Electronically Signed   By: Misty Stanley M.D.   On: 07/29/2020 14:09    Procedures Procedures (including critical care time)  Medications Ordered in ED Medications  sodium chloride flush (NS) 0.9 %  injection 3 mL (has no administration in time range)   Alphonzo Severance was evaluated in Emergency Department on 07/29/2020 for the symptoms described in the history of present illness. She was evaluated in the context of the global COVID-19 pandemic, which necessitated consideration that the patient might be at risk for infection with the SARS-CoV-2 virus that causes COVID-19. Institutional protocols and algorithms that pertain to the evaluation of patients at risk for COVID-19 are in a state of rapid change based on information released by regulatory bodies including the CDC and federal and state organizations. These policies and algorithms were followed during the patient's care in the ED.   ED Course  I have reviewed the triage vital signs and the nursing notes.  Pertinent labs & imaging results that were available during my care of the patient were reviewed by me and considered in my medical decision making (see chart for details).    MDM Rules/Calculators/A&P                          Taylor Banks is a 74 y.o. female with a past medical with a past medical history significant for hypertension, hyperlipidemia, GERD, prior cervical cancer, carotid artery occlusion status post endarterectomy in 2007 and 2018, tobacco abuse, and recent diagnosis of COVID-19 infection last week who presents with worsening fatigue, shortness of breath, cough, nausea, vomiting, and malaise.  On arrival, oxygen saturation is 87% on room air and patient is tachypneic.  Patient says that she has been having a nonproductive cough that has been worsening is feeling more short of breath.  She feels very fatigued.  She reportedly called her family this morning and she has not been eating or drinking and has been having nausea and vomiting when she tries to eat.  She denies any chest pain or palpitations.  She denies any abdominal pain or back pain.  She denies chest pain to me.  She is primarily feeling lightheaded  and fatigued.  On exam, lungs have coarseness bilaterally.  Chest is nontender.  Abdomen is nontender.  No murmur.  Good pulses in  extremities.  No lower extremity tenderness or edema and she denies any lower extremity symptoms.  EKG showed no STEMI but did show a T wave inversion.  Patient was placed on 5 L nasal cannula to maintain oxygen saturations above 90%.  Given her Covid diagnosis and new hypoxia, she will need admission.  Patient had x-ray showing opacities consistent with Covid.  Her nausea and vomiting likely led to the hyponatremia and hypochloremia.  Creatinine also slightly more elevated than prior.  No leukocytosis or anemia.  Troponin was ordered and is waiting to be collected.  She denies any urinary symptoms but will check that for the fatigue.  We will call for admission for new oxygen requirement in the setting of Covid.  At this time, given lack of pain or tachycardia, lower suspicion for pulmonary embolism.  She will be admitted.    Final Clinical Impression(s) / ED Diagnoses Final diagnoses:  Fatigue, unspecified type  COVID-19  Hypoxia   Clinical Impression: 1. Fatigue, unspecified type   2. COVID-19   3. Hypoxia     Disposition: Admit  This note was prepared with assistance of Dragon voice recognition software. Occasional wrong-word or sound-a-like substitutions may have occurred due to the inherent limitations of voice recognition software.     Tyrese Ficek, Gwenyth Allegra, MD 07/29/20 1451

## 2020-07-29 NOTE — ED Notes (Signed)
PT removes nasal O2  And SPO2 drops. PT instructed not to remove nasal O2.

## 2020-07-29 NOTE — ED Notes (Signed)
Admitting paged to RN per his request  

## 2020-07-29 NOTE — ED Triage Notes (Signed)
Family brought patient to the ED. Continued to "not feel well" who has been diagnosed with COVID recently. Increased weakness, cough, back pain, and emesis.

## 2020-07-29 NOTE — H&P (Signed)
History and Physical:    Taylor Banks   XBL:[id number] DOB: 08-26-1946 DOA: 07/29/2020  Referring MD/provider: Dr. Sherry Ruffing PCP: Redmond School, MD   Patient coming from: Home  Chief Complaint: Covid infection  History of Present Illness:   Taylor Banks is an 74 y.o. female with PMH significant for HTN and cervical cancer status post hysterectomy was in her usual state of health until last week when her husband was diagnosed with Covid infection when he was routinely tested at a hospital when he presented for treatment of hypoglycemia.  Patient decided to get herself tested at the time and was also noted to be +7 days ago.  Since that time patient states she has been progressively feeling worse she is not sure how.  Patient states that her appetite is okay and that she has been urinating sufficiently.  She does admit to feeling tired but denies overt shortness of breath.  Patient denies that she is confused.  When asked why she kept taking her oxygen off she stated "I cannot understand anybody with all the stuff on my face".  Note she does not like having a mask on her face.  However states that she knows she is at [facility] for treatment of Covid.   ED Course:  The patient was noted to be hypoxic and was placed on oxygen and was saturating well on 5 L nasal cannula.  Chest x-ray showed patchy infiltrates consistent with Covid pneumonia patient was called in for admission.  ROS:   ROS   Review of Systems: Patient states she is too tired to answer too many questions but she does deny abdominal pain, nausea or vomiting or diarrhea.  She states she has back pain that is worsened with coughing.  She does not think she has had decreased appetite.  Past Medical History:   Past Medical History:  Diagnosis Date   Cancer ([place])    cancerous cells in cervix -- had no treatments,just hysterectomy   Carotid artery occlusion    Status post left CEA 2007 - Dr. Donnetta Hutching   GERD  (gastroesophageal reflux disease)    takes otc when she eats wrong foods   Hyperlipidemia    Hypertension    Obesity     Past Surgical History:   Past Surgical History:  Procedure Laterality Date   ABDOMINAL HYSTERECTOMY     Partial   CAROTID ENDARTERECTOMY Left 2007   CATARACT EXTRACTION W/PHACO Left 10/24/2019   Procedure: CATARACT EXTRACTION PHACO AND INTRAOCULAR LENS PLACEMENT LEFT EYE;  Surgeon: Baruch Goldmann, MD;  Location: [facility];  Service: Ophthalmology;  Laterality: Left;  CDE: 5.58   CATARACT EXTRACTION W/PHACO Right 11/10/2019   Procedure: CATARACT EXTRACTION PHACO AND INTRAOCULAR LENS PLACEMENT (IOC) (CDE: 6.81);  Surgeon: Baruch Goldmann, MD;  Location: [facility];  Service: Ophthalmology;  Laterality: Right;   DIAGNOSTIC MAMMOGRAM  Nov. 26,2013   ENDARTERECTOMY Right 09/09/2017   Procedure: ENDARTERECTOMY CAROTID- RIGHT;  Surgeon: Rosetta Posner, MD;  Location: [facility];  Service: Vascular;  Laterality: Right;   PATCH ANGIOPLASTY Right 09/09/2017   Procedure: [place];  Surgeon: Rosetta Posner, MD;  Location: [facility] OR;  Service: Vascular;  Laterality: Right;    Social History:   Social History   Socioeconomic History   Marital status: Married    Spouse name: Not on file   Number of children: Not on file   Years of education: Not on file   Highest education  level: Not on file  Occupational History   Not on file  Tobacco Use   Smoking status: Current Every Day Smoker    Packs/day: 1.00    Years: 35.00    Pack years: 35.00    Types: Cigarettes   Smokeless tobacco: Never Used   Tobacco comment: 'she's trying, used the patch, did go cold [place]  Vaping Use   Vaping Use: Never used  Substance and Sexual Activity   Alcohol use: No   Drug use: No   Sexual activity: Not on file  Other Topics Concern   Not on file  Social History Narrative   Not on file   Social Determinants of Health   Financial  Resource Strain:    Difficulty of Paying Living Expenses:   Food Insecurity:    Worried About Charity fundraiser in the Last Year:    Arboriculturist in the Last Year:   Transportation Needs:    Film/video editor (Medical):    Lack of Transportation (Non-Medical):   Physical Activity:    Days of Exercise per Week:    Minutes of Exercise per Session:   Stress:    Feeling of Stress :   Social Connections:    Frequency of Communication with Friends and Family:    Frequency of Social Gatherings with Friends and Family:    Attends Religious Services:    Active Member of Clubs or Organizations:    Attends Archivist Meetings:    Marital Status:   Intimate Partner Violence:    Fear of Current or Ex-Partner:    Emotionally Abused:    Physically Abused:    Sexually Abused:     Allergies   Losartan  Family history:   Family History  Problem Relation Age of Onset   Stroke Mother    Hypertension Mother    Stroke Father    Diabetes Sister    Diabetes Sister    Heart disease Brother    Heart disease Son     Current Medications:   Prior to Admission medications   Medication Sig Start Date End Date Taking? Authorizing Provider  atorvastatin (LIPITOR) 40 MG tablet Take 40 mg by mouth daily. 06/15/20  Yes [provider]  benzonatate (TESSALON) 100 MG capsule Take 1 capsule (100 mg total) by mouth every 8 (eight) hours. 07/22/20  Yes [place], [place], PA-C  diltiazem (CARDIZEM CD) 120 MG 24 hr capsule Take 120 mg by mouth daily. 06/25/20  Yes [provider]    Physical Exam:   Vitals:   07/29/20 1556 07/29/20 1601 07/29/20 1616 07/29/20 1634  BP:  (!) 128/48 (!) 113/48   Pulse: 65 65 66   Resp: (!) 32 (!) 33 (!) 38   Temp:      TempSrc:      SpO2: 92%   97%  Weight:         Physical Exam: Blood pressure (!) 113/48, pulse 66, temperature 98.2 F (36.8 C), temperature source Oral, resp. rate (!) 38, weight 80 kg,  SpO2 97 %. Gen: Unwell appearing patient lying in bed complaining of back pain. Eyes: sclera anicteric, conjuctiva mildly injected bilaterally CVS: S1-S2, regulary, no gallops Respiratory: Normal air entry with rales at bases.  She does have tenderness to light palpation in right posterior rib GI: NABS, soft, NT  LE: No edema. No cyanosis Neuro: A/O x 3, Moving all extremities equally with normal strength, CN 3-12 intact, grossly nonfocal.  Psych: patient  is coherent although at times she is vague in her answers, judgement and insight are poor   Data Review:    Labs: Basic Metabolic Panel: Recent Labs  Lab 07/29/20 1234  NA 126*  K 4.2  CL 91*  CO2 22  GLUCOSE 180*  BUN 34*  CREATININE 1.29*  CALCIUM 8.1*   Liver Function Tests: No results for input(s): AST, ALT, ALKPHOS, BILITOT, PROT, ALBUMIN in the last 168 hours. No results for input(s): LIPASE, AMYLASE in the last 168 hours. No results for input(s): AMMONIA in the last 168 hours. CBC: Recent Labs  Lab 07/29/20 1234  WBC 7.0  HGB 14.7  HCT 42.8  MCV 90.9  PLT 186   Cardiac Enzymes: No results for input(s): CKTOTAL, CKMB, CKMBINDEX, TROPONINI in the last 168 hours.  BNP (last 3 results) No results for input(s): PROBNP in the last 8760 hours. CBG: No results for input(s): GLUCAP in the last 168 hours.  Urinalysis    Component Value Date/Time   COLORURINE YELLOW 09/03/2017 0912   APPEARANCEUR CLEAR 09/03/2017 0912   LABSPEC 1.018 09/03/2017 0912   PHURINE 5.0 09/03/2017 0912   GLUCOSEU NEGATIVE 09/03/2017 0912   HGBUR NEGATIVE 09/03/2017 0912   BILIRUBINUR NEGATIVE 09/03/2017 0912   KETONESUR NEGATIVE 09/03/2017 0912   PROTEINUR NEGATIVE 09/03/2017 0912   NITRITE NEGATIVE 09/03/2017 0912   LEUKOCYTESUR NEGATIVE 09/03/2017 0912      Radiographic Studies: DG Chest Portable 1 View  Result Date: 07/29/2020 CLINICAL DATA:  COVID positive.  Hypoxia. EXAM: PORTABLE CHEST 1 VIEW COMPARISON:  08/17/2007  FINDINGS: 1338 hours. Low lung volumes. The cardiopericardial silhouette is within normal limits for size. Patchy areas of peripheral ground-glass attenuation are noted in the lungs bilaterally with mid and lower lung predominance. No associated pleural effusion. The visualized bony structures of the thorax show now acute abnormality. Telemetry leads overlie the chest. IMPRESSION: Patchy areas of peripheral ground-glass opacity, compatible with the reported history of COVID infection. Electronically Signed   By: Misty Stanley M.D.   On: 07/29/2020 14:09    EKG: Independently reviewed.  Sinus rhythm at 90.  Left axis deviation.  RBBB.   Assessment/Plan:   Principal Problem:   Pneumonia due to COVID-19 virus Active Problems:   Hypertension  74 year old female presents with Covid pneumonia.  She denies confusion she seems to have a hard time keeping the nasal cannula in place.  She is also complaining of back pain with movement   Pneumonia due to COVID-19 We will try facemask for oxygen to see if that is more comfortable for the patient, have stressed the importance of keeping oxygen on. We will start remdesivir and Decadron Procalcitonin and CRP are ordered by me.  Rib pain Patient states that her rib pain started when she was coughing. She does have tenderness to palpation of the right posterior 10th rib. However D-dimer D-dimer has not been done, will order D-dimer now If markedly elevated, would have low threshold for CTA.  Hyponatremia Likely secondary to decreased p.o. intake although patient denies that she has had decreased p.o. intake labs are certainly consistent with some dehydration. Hopefully this will improve as Covid infection improves  Hyperglycemia We will check fingersticks 3 times daily AC, no SSI ordered as patient does not have a diagnosis of DM A1c ordered for the morning  AKI Likely secondary to intravascular volume depletion and direct Covid infection Will  need to follow closely and avoid renal toxic medications  HTN Hold diltiazem given relatively low  blood pressures and likely intravascular fluid depletion at present    Other information:   DVT prophylaxis: Enoxaparin ordered. Code Status: Full Family Communication: Patient states her family knows she is here Disposition Plan: Home Consults called: None Admission status: Inpatient  [facility]  If 7PM-7AM, please contact night-coverage [email or web address] Password [facility] 07/29/2020, 4:37 PM

## 2020-07-30 ENCOUNTER — Inpatient Hospital Stay (HOSPITAL_COMMUNITY): Payer: PPO

## 2020-07-30 DIAGNOSIS — R0902 Hypoxemia: Secondary | ICD-10-CM

## 2020-07-30 DIAGNOSIS — R7989 Other specified abnormal findings of blood chemistry: Secondary | ICD-10-CM

## 2020-07-30 DIAGNOSIS — U071 COVID-19: Secondary | ICD-10-CM

## 2020-07-30 DIAGNOSIS — I10 Essential (primary) hypertension: Secondary | ICD-10-CM

## 2020-07-30 LAB — CBC WITH DIFFERENTIAL/PLATELET
Abs Immature Granulocytes: 0.04 10*3/uL (ref 0.00–0.07)
Basophils Absolute: 0 10*3/uL (ref 0.0–0.1)
Basophils Relative: 0 %
Eosinophils Absolute: 0 10*3/uL (ref 0.0–0.5)
Eosinophils Relative: 0 %
HCT: 38.7 % (ref 36.0–46.0)
Hemoglobin: 13.4 g/dL (ref 12.0–15.0)
Immature Granulocytes: 1 %
Lymphocytes Relative: 11 %
Lymphs Abs: 0.6 10*3/uL — ABNORMAL LOW (ref 0.7–4.0)
MCH: 31.3 pg (ref 26.0–34.0)
MCHC: 34.6 g/dL (ref 30.0–36.0)
MCV: 90.4 fL (ref 80.0–100.0)
Monocytes Absolute: 0.3 10*3/uL (ref 0.1–1.0)
Monocytes Relative: 5 %
Neutro Abs: 4.3 10*3/uL (ref 1.7–7.7)
Neutrophils Relative %: 83 %
Platelets: 173 10*3/uL (ref 150–400)
RBC: 4.28 MIL/uL (ref 3.87–5.11)
RDW: 12.3 % (ref 11.5–15.5)
WBC: 5.2 10*3/uL (ref 4.0–10.5)
nRBC: 0 % (ref 0.0–0.2)

## 2020-07-30 LAB — COMPREHENSIVE METABOLIC PANEL WITH GFR
ALT: 52 U/L — ABNORMAL HIGH (ref 0–44)
AST: 69 U/L — ABNORMAL HIGH (ref 15–41)
Albumin: 2.6 g/dL — ABNORMAL LOW (ref 3.5–5.0)
Alkaline Phosphatase: 43 U/L (ref 38–126)
Anion gap: 11 (ref 5–15)
BUN: 39 mg/dL — ABNORMAL HIGH (ref 8–23)
CO2: 23 mmol/L (ref 22–32)
Calcium: 8 mg/dL — ABNORMAL LOW (ref 8.9–10.3)
Chloride: 96 mmol/L — ABNORMAL LOW (ref 98–111)
Creatinine, Ser: 1.31 mg/dL — ABNORMAL HIGH (ref 0.44–1.00)
GFR calc Af Amer: 46 mL/min — ABNORMAL LOW
GFR calc non Af Amer: 40 mL/min — ABNORMAL LOW
Glucose, Bld: 199 mg/dL — ABNORMAL HIGH (ref 70–99)
Potassium: 4.1 mmol/L (ref 3.5–5.1)
Sodium: 130 mmol/L — ABNORMAL LOW (ref 135–145)
Total Bilirubin: 1.4 mg/dL — ABNORMAL HIGH (ref 0.3–1.2)
Total Protein: 5.7 g/dL — ABNORMAL LOW (ref 6.5–8.1)

## 2020-07-30 LAB — GLUCOSE, CAPILLARY
Glucose-Capillary: 189 mg/dL — ABNORMAL HIGH (ref 70–99)
Glucose-Capillary: 186 mg/dL — ABNORMAL HIGH (ref 70–99)
Glucose-Capillary: 195 mg/dL — ABNORMAL HIGH (ref 70–99)
Glucose-Capillary: 229 mg/dL — ABNORMAL HIGH (ref 70–99)

## 2020-07-30 LAB — C-REACTIVE PROTEIN: CRP: 14.2 mg/dL — ABNORMAL HIGH

## 2020-07-30 MED ORDER — SODIUM CHLORIDE 0.9 % IV SOLN: INTRAVENOUS | Status: AC

## 2020-07-30 MED ORDER — PANTOPRAZOLE SODIUM 40 MG PO TBEC
40.0000 mg | DELAYED_RELEASE_TABLET | Freq: Every day | ORAL | Status: DC
  Administered 2020-07-30 – 2020-08-04 (×6): 40 mg via ORAL
  Filled 2020-07-30 (×6): qty 1

## 2020-07-30 MED ORDER — ENOXAPARIN SODIUM 80 MG/0.8ML ~~LOC~~ SOLN
70.0000 mg | Freq: Two times a day (BID) | SUBCUTANEOUS | Status: DC
  Administered 2020-07-30 – 2020-08-03 (×7): 70 mg via SUBCUTANEOUS
  Filled 2020-07-30 (×9): qty 0.8

## 2020-07-30 MED ORDER — ENSURE ENLIVE PO LIQD
237.0000 mL | Freq: Two times a day (BID) | ORAL | Status: DC
  Administered 2020-07-30 – 2020-07-31 (×3): 237 mL via ORAL

## 2020-07-30 NOTE — Progress Notes (Signed)
ANTICOAGULATION CONSULT NOTE - Initial Consult  Pharmacy Consult for Lovenox Indication: DVT  Allergies  Allergen Reactions  . Losartan Nausea And Vomiting    Patient Measurements: Height: 5' (152.4 cm) Weight: 68.2 kg (150 lb 5.7 oz) IBW/kg (Calculated) : 45.5  Vital Signs:    Labs: Recent Labs    07/29/20 1234 07/29/20 1234 07/29/20 1905 07/30/20 0355  HGB 14.7   < > 13.0 13.4  HCT 42.8  --  38.0 38.7  PLT 186  --  175 173  CREATININE 1.29*  --  1.38* 1.31*  TROPONINIHS  --   --  33*  --    < > = values in this interval not displayed.    Estimated Creatinine Clearance: 32.5 mL/min (A) (by C-G formula based on SCr of 1.31 mg/dL (H)).   Medical History: Past Medical History:  Diagnosis Date  . Cancer ([place])    cancerous cells in cervix -- had no treatments,just hysterectomy  . Carotid artery occlusion    Status post left CEA 2007 - Dr. Donnetta Hutching  . GERD (gastroesophageal reflux disease)    takes otc when she eats wrong foods  . Hyperlipidemia   . Hypertension   . Obesity      Assessment: 74 years of age admitted with COVID on Lovenox for DVT prophylaxis now with acute DVT to increase to treatment dose Lovenox. Last dose of Lovenox 40mg  given 8/1 at 1700 PM.   CBC is stable. No bleeding noted. SCr is trending back down with current CrCl >30 mL/min.   Goal of Therapy:  Anti-Xa level 0.6-1 units/ml 4hrs after LMWH dose given Monitor platelets by anticoagulation protocol: Yes   Plan:  Increase Lovenox to 70mg  subcutaneous injection every 12 hours.  Monitor renal function, CBC, and clinical status.  Follow-up long-term anticoagulant plan.   Sloan Leiter, PharmD, BCPS, BCCCP Clinical Pharmacist Please refer to [facility] for [place] numbers 07/30/2020,4:28 PM

## 2020-07-30 NOTE — Progress Notes (Signed)
PROGRESS NOTE                                                                                                                                                                                                             Patient Demographics:    Taylor Banks, is a 74 y.o. female, DOB - 1946-07-01, MHD:[id number]  Admit date - 07/29/2020   Admitting Physician Vashti Hey, MD  Outpatient Primary MD for the patient is Redmond School, MD  LOS - 1   Chief Complaint  Patient presents with  . Fatigue  . Back Pain  . Cough       Brief Narrative     Taylor Banks is an 74 y.o. female with PMH significant for HTN and cervical cancer status post hysterectomy was in her usual state of health until last week when her husband was diagnosed with Covid infection when he was routinely tested at a hospital when he presented for treatment of hypoglycemia.  Patient decided to get herself tested at the time and was also noted to be +7 days ago.  Since that time patient states she has been progressively feeling worse she is not sure how.  Patient states that her appetite is okay and that she has been urinating sufficiently.  She does admit to feeling tired but denies overt shortness of breath.  Patient denies that she is confused.  When asked why she kept taking her oxygen off she stated "I cannot understand anybody with all the stuff on my face".  Note she does not like having a mask on her face.  However states that she knows she is at [facility] for treatment of Covid.   ED Course:  The patient was noted to be hypoxic and was placed on oxygen and was saturating well on 5 L nasal cannula.  Chest x-ray showed patchy infiltrates consistent with Covid pneumonia patient was called in for admission.    Subjective:    [name] today has, No headache, No chest pain, No abdominal pain , he does report dyspnea, cough, generalized weakness and poor  appetite.   Assessment  & Plan :    Principal Problem:   Pneumonia due to COVID-19 virus Active Problems:   Hypertension   Principal Problem:   Pneumonia due to COVID-19 virus Active Problems:   Hypertension  Acute hypoxic respiratory failure due to COVID-19 pneumonia . -Patient with hypoxia did drop to 85% on room air, but currently appears to be tolerating 2 L nasal cannula . -Continue with Decadron . -Continue with IV remdesivir . -He was encouraged to get out of bed to chair, use incentive spirometry, and flutter valve . -She has consented to Actemra if indicated, denies any history of hepatitis, TB, or diverticulitis no active malignancy (cervical cancer more than 30 years ago) , so far no indication given she is on 2 L nasal cannula, will monitor closely . -Continue to trend inflammatory markers closely  Evaded D-dimers -CTA chest negative for PE, will check venous Dopplers  Hyponatremia -Due to volume depletion and dehydration, continue with IV fluids.  Transaminitis -Due to Covid, continue to monitor.  Hyperglycemia -A1c of 7, will start on sliding scale specially she is on steroids.  AKI Likely secondary to intravascular volume depletion and direct Covid infection Will need to follow closely and avoid renal toxic medications  HTN Hold diltiazem given relatively low blood pressures and likely intravascular fluid depletion at present  COVID-19 Labs  Recent Labs    07/29/20 1905 07/30/20 0355  DDIMER 3.37*  --   CRP 11.9* 14.2*    Lab Results  Component Value Date   SARSCOV2NAA Detected (A) 07/22/2020   [place] NEGATIVE 11/08/2019   [place] NEGATIVE 10/20/2019     Code Status : Full  Family Communication  : tried spouse with no answer.  Disposition Plan  :  Status is: Inpatient  Remains inpatient appropriate because:IV treatments appropriate due to intensity of illness or inability to take PO and Inpatient level of care  appropriate due to severity of illness   Dispo: The patient is from: Home              Anticipated d/c is to: Home              Anticipated d/c date is: 3 days              Patient currently is not medically stable to d/c.     Consults  :  None   Procedures  : None  DVT Prophylaxis  :   lovenopx  Lab Results  Component Value Date   PLT 173 07/30/2020    Antibiotics  :    Anti-infectives (From admission, onward)   Start     Dose/Rate Route Frequency Ordered Stop   07/30/20 1000  remdesivir 100 mg in sodium chloride 0.9 % 100 mL IVPB     Discontinue    "Followed by" Linked Group Details   100 mg 200 mL/hr over 30 Minutes Intravenous Daily 07/29/20 1640 08/03/20 0959   07/29/20 1645  remdesivir 200 mg in sodium chloride 0.9% 250 mL IVPB       "Followed by" Linked Group Details   200 mg 580 mL/hr over 30 Minutes Intravenous Once 07/29/20 1640 07/29/20 1858        Objective:   Vitals:   07/29/20 2115 07/29/20 2245 07/30/20 0000 07/30/20 0410  BP: (!) 117/57 (!) 143/64 (!) 105/54 (!) 124/60  Pulse: 49 56 52 51  Resp: (!) 26 23 19 19   Temp:  (!) 97.4 F (36.3 C) (!) 97.3 F (36.3 C) 97.6 F (36.4 C)  TempSrc:  Oral Oral Oral  SpO2: 92% 96% 96% 96%  Weight:  68.2 kg    Height:  5' (1.524 m)      Wt Readings from Last 3  Encounters:  07/29/20 68.2 kg  10/24/19 83.9 kg  04/06/18 76.9 kg     Intake/Output Summary (Last 24 hours) at 07/30/2020 1156 Last data filed at 07/30/2020 0900 Gross per 24 hour  Intake 120 ml  Output --  Net 120 ml     Physical Exam  Awake Alert, Oriented X 3, No new F.N deficits, Normal affect Symmetrical Chest wall movement, Good air movement bilaterally, CTAB RRR,No Gallops,Rubs or new Murmurs, No Parasternal Heave +ve B.Sounds, Abd Soft, No tenderness, No organomegaly appriciated, No rebound - guarding or rigidity. No Cyanosis, Clubbing or edema, No new Rash or bruise      Data Review:    CBC Recent Labs  Lab  07/29/20 1234 07/29/20 1905 07/30/20 0355  WBC 7.0 6.1 5.2  HGB 14.7 13.0 13.4  HCT 42.8 38.0 38.7  PLT 186 175 173  MCV 90.9 90.3 90.4  MCH 31.2 30.9 31.3  MCHC 34.3 34.2 34.6  RDW 12.3 12.3 12.3  LYMPHSABS  --   --  0.6*  MONOABS  --   --  0.3  EOSABS  --   --  0.0  BASOSABS  --   --  0.0    Chemistries  Recent Labs  Lab 07/29/20 1234 07/29/20 1905 07/30/20 0355  NA 126*  --  130*  K 4.2  --  4.1  CL 91*  --  96*  CO2 22  --  23  GLUCOSE 180*  --  199*  BUN 34*  --  39*  CREATININE 1.29* 1.38* 1.31*  CALCIUM 8.1*  --  8.0*  AST  --   --  69*  ALT  --   --  52*  ALKPHOS  --   --  43  BILITOT  --   --  1.4*   ------------------------------------------------------------------------------------------------------------------ No results for input(s): CHOL, HDL, LDLCALC, TRIG, CHOLHDL, LDLDIRECT in the last 72 hours.  Lab Results  Component Value Date   HGBA1C 7.0 (H) 07/29/2020   ------------------------------------------------------------------------------------------------------------------ No results for input(s): TSH, T4TOTAL, T3FREE, THYROIDAB in the last 72 hours.  Invalid input(s): FREET3 ------------------------------------------------------------------------------------------------------------------ No results for input(s): VITAMINB12, FOLATE, FERRITIN, TIBC, IRON, RETICCTPCT in the last 72 hours.  Coagulation profile No results for input(s): INR, PROTIME in the last 168 hours.  Recent Labs    07/29/20 1905  DDIMER 3.37*    Cardiac Enzymes No results for input(s): CKMB, TROPONINI, MYOGLOBIN in the last 168 hours.  Invalid input(s): CK ------------------------------------------------------------------------------------------------------------------ No results found for: BNP  Inpatient Medications  Scheduled Meds: . benzonatate  100 mg Oral Q8H  . dexamethasone (DECADRON) injection  6 mg Intravenous Q24H  . enoxaparin (LOVENOX) injection  40  mg Subcutaneous Q24H  . feeding supplement (ENSURE ENLIVE)  237 mL Oral BID BM  . sodium chloride flush  3 mL Intravenous Once   Continuous Infusions: . remdesivir 100 mg in NS 100 mL 100 mg (07/30/20 1018)   PRN Meds:.acetaminophen, polyethylene glycol, traMADol  Micro Results Recent Results (from the past 240 hour(s))  Novel Coronavirus, NAA ([facility])     Status: Abnormal   Collection Time: 07/22/20  3:22 PM   Specimen: Nasopharyngeal Swab; Nasopharyngeal(NP) swabs in vial transport medium   Nasopharynge  Patient  Result Value Ref Range Status   SARS-CoV-2, NAA Detected (A) Not Detected Final    Comment: Patients who have a positive COVID-19 test result may now have treatment options. Treatment options are available for patients with mild to moderate symptoms and for hospitalized patients. Visit  our website at [email or web address] for resources and information. This nucleic acid amplification test was developed and its performance characteristics determined by [employer]. Nucleic acid amplification tests include RT-PCR and TMA. This test has not been FDA cleared or approved. This test has been authorized by FDA under an Emergency Use Authorization (EUA). This test is only authorized for the duration of time the declaration that circumstances exist justifying the authorization of the emergency use of in vitro diagnostic tests for detection of SARS-CoV-2 virus and/or diagnosis of COVID-19 infection under section 564(b)(1) of the Act, 21 U.S.C. 409BDZ-3(G) (1), unless the authorization is terminated or revoked sooner. When diagnostic testing is negativ e, the possibility of a false negative result should be considered in the context of a patient's recent exposures and the presence of clinical signs and symptoms consistent with COVID-19. An individual without symptoms of COVID-19 and who is not shedding SARS-CoV-2 virus would expect to have a negative (not  detected) result in this assay.   SARS-COV-2, NAA 2 DAY TAT     Status: None   Collection Time: 07/22/20  3:22 PM   Nasopharynge  Patient  Result Value Ref Range Status   SARS-CoV-2, NAA 2 DAY TAT Performed  Final    Radiology Reports CT ANGIO CHEST PE W OR WO CONTRAST  Result Date: 07/29/2020 CLINICAL DATA:  PE suspected, high prob COVID positive. EXAM: CT ANGIOGRAPHY CHEST WITH CONTRAST TECHNIQUE: Multidetector CT imaging of the chest was performed using the standard protocol during bolus administration of intravenous contrast. Multiplanar CT image reconstructions and MIPs were obtained to evaluate the vascular anatomy. CONTRAST:  28mL OMNIPAQUE IOHEXOL 350 MG/ML SOLN COMPARISON:  Chest radiograph earlier this day. FINDINGS: Cardiovascular: There are no filling defects within the pulmonary arteries to suggest pulmonary embolus. Aortic atherosclerosis without aneurysm. There are coronary artery calcifications. Heart is normal in size. No pericardial effusion. Mediastinum/Nodes: Mildly enlarged subcarinal and right hilar lymph nodes. No visualized thyroid nodule. No esophageal wall thickening. Lungs/Pleura: Diffuse bilateral ground-glass opacities in the peripheral and slight basilar predominant distribution. Parenchymal involvement is moderate to advanced. There is no pleural fluid. No pneumothorax. Upper Abdomen: No acute or unexpected findings. 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 There are no acute or suspicious osseous abnormalities. Review of the MIP images confirms the above findings. IMPRESSION: 1. No pulmonary embolus. 2. Diffuse bilateral ground-glass opacities consistent with COVID-19 pneumonia. Moderate to advanced parenchymal involvement. 3. Mildly enlarged subcarinal and right hilar lymph nodes are likely reactive. 4. Coronary artery calcifications. Aortic Atherosclerosis (ICD10-I70.0). Electronically Signed   By: Keith Rake M.D.   On: 07/29/2020 22:50    DG Chest Portable 1 View  Result Date: 07/29/2020 CLINICAL DATA:  COVID positive.  Hypoxia. EXAM: PORTABLE CHEST 1 VIEW COMPARISON:  08/17/2007 FINDINGS: 1338 hours. Low lung volumes. The cardiopericardial silhouette is within normal limits for size. Patchy areas of peripheral ground-glass attenuation are noted in the lungs bilaterally with mid and lower lung predominance. No associated pleural effusion. The visualized bony structures of the thorax show now acute abnormality. Telemetry leads overlie the chest. IMPRESSION: Patchy areas of peripheral ground-glass opacity, compatible with the reported history of COVID infection. Electronically Signed   By: Misty Stanley M.D.   On: 07/29/2020 14:09     Phillips Climes M.D on 07/30/2020 at 11:56 AM    [facility] -  Office  [phone]

## 2020-07-30 NOTE — Progress Notes (Signed)
Inpatient Diabetes Program Recommendations  AACE/ADA: New Consensus Statement on Inpatient Glycemic Control (2015)  Target Ranges:  Prepandial:   less than 140 mg/dL      Peak postprandial:   less than 180 mg/dL (1-2 hours)      Critically ill patients:  140 - 180 mg/dL   Lab Results  Component Value Date   GLUCAP 186 (H) 07/30/2020   HGBA1C 7.0 (H) 07/29/2020    Review of Glycemic Control  Diabetes history: None Outpatient Diabetes medications: N/A Current orders for Inpatient glycemic control: None  HgbA1C - 7.0% Blood sugars above goal with Decadron 6 mg Q24H  Inpatient Diabetes Program Recommendations:     Add Novolog 0-9 units tidwc   Will continue to follow.  Thank you. Lorenda Peck, RD, LDN, CDE Inpatient Diabetes Coordinator [phone]

## 2020-07-30 NOTE — Progress Notes (Signed)
VASCULAR LAB PRELIMINARY  PRELIMINARY  PRELIMINARY  PRELIMINARY  Bilateral lower extremity venous duplex completed.    Preliminary report:  See CV proc for preliminary results.  Messaged Dr. [facility] with results.  Lutricia Widjaja, RVT 07/30/2020, 4:12 PM

## 2020-07-31 LAB — COMPREHENSIVE METABOLIC PANEL
ALT: 52 U/L — ABNORMAL HIGH (ref 0–44)
AST: 48 U/L — ABNORMAL HIGH (ref 15–41)
Albumin: 2.6 g/dL — ABNORMAL LOW (ref 3.5–5.0)
Alkaline Phosphatase: 47 U/L (ref 38–126)
Anion gap: 11 (ref 5–15)
BUN: 38 mg/dL — ABNORMAL HIGH (ref 8–23)
CO2: 23 mmol/L (ref 22–32)
Calcium: 8.4 mg/dL — ABNORMAL LOW (ref 8.9–10.3)
Chloride: 98 mmol/L (ref 98–111)
Creatinine, Ser: 1.02 mg/dL — ABNORMAL HIGH (ref 0.44–1.00)
GFR calc Af Amer: >60 mL/min (ref >60)
GFR calc non Af Amer: 54 mL/min — ABNORMAL LOW (ref >60)
Glucose, Bld: 219 mg/dL — ABNORMAL HIGH (ref 70–99)
Potassium: 3.8 mmol/L (ref 3.5–5.1)
Sodium: 132 mmol/L — ABNORMAL LOW (ref 135–145)
Total Bilirubin: 1.1 mg/dL (ref 0.3–1.2)
Total Protein: 5.8 g/dL — ABNORMAL LOW (ref 6.5–8.1)

## 2020-07-31 LAB — HEMOGLOBIN A1C
Hgb A1c MFr Bld: 7.1 % — ABNORMAL HIGH (ref 4.8–5.6)
Mean Plasma Glucose: 157.07 mg/dL

## 2020-07-31 LAB — CBC WITH DIFFERENTIAL/PLATELET
Abs Immature Granulocytes: 0.08 10*3/uL — ABNORMAL HIGH (ref 0.00–0.07)
Basophils Absolute: 0 10*3/uL (ref 0.0–0.1)
Basophils Relative: 0 %
Eosinophils Absolute: 0 10*3/uL (ref 0.0–0.5)
Eosinophils Relative: 0 %
HCT: 40.7 % (ref 36.0–46.0)
Hemoglobin: 14.4 g/dL (ref 12.0–15.0)
Immature Granulocytes: 1 %
Lymphocytes Relative: 10 %
Lymphs Abs: 0.9 10*3/uL (ref 0.7–4.0)
MCH: 31.9 pg (ref 26.0–34.0)
MCHC: 35.4 g/dL (ref 30.0–36.0)
MCV: 90 fL (ref 80.0–100.0)
Monocytes Absolute: 0.6 10*3/uL (ref 0.1–1.0)
Monocytes Relative: 8 %
Neutro Abs: 6.8 10*3/uL (ref 1.7–7.7)
Neutrophils Relative %: 81 %
Platelets: 253 10*3/uL (ref 150–400)
RBC: 4.52 MIL/uL (ref 3.87–5.11)
RDW: 12.3 % (ref 11.5–15.5)
WBC: 8.4 10*3/uL (ref 4.0–10.5)
nRBC: 0 % (ref 0.0–0.2)

## 2020-07-31 LAB — GLUCOSE, CAPILLARY
Glucose-Capillary: 186 mg/dL — ABNORMAL HIGH (ref 70–99)
Glucose-Capillary: 186 mg/dL — ABNORMAL HIGH (ref 70–99)
Glucose-Capillary: 192 mg/dL — ABNORMAL HIGH (ref 70–99)
Glucose-Capillary: 200 mg/dL — ABNORMAL HIGH (ref 70–99)
Glucose-Capillary: 196 mg/dL — ABNORMAL HIGH (ref 70–99)

## 2020-07-31 LAB — C-REACTIVE PROTEIN: CRP: 8.8 mg/dL — ABNORMAL HIGH (ref <1.0)

## 2020-07-31 LAB — PHOSPHORUS: Phosphorus: 4 mg/dL (ref 2.5–4.6)

## 2020-07-31 LAB — D-DIMER, QUANTITATIVE: D-Dimer, Quant: 2.34 ug/mL-FEU — ABNORMAL HIGH (ref 0.00–0.50)

## 2020-07-31 MED ORDER — INSULIN ASPART 100 UNIT/ML ~~LOC~~ SOLN
0.0000 [IU] | Freq: Three times a day (TID) | SUBCUTANEOUS | Status: DC
  Administered 2020-07-31 (×2): 3 [IU] via SUBCUTANEOUS
  Administered 2020-08-01 – 2020-08-02 (×4): 2 [IU] via SUBCUTANEOUS
  Administered 2020-08-03: 5 [IU] via SUBCUTANEOUS
  Administered 2020-08-03: 3 [IU] via SUBCUTANEOUS
  Administered 2020-08-03: 2 [IU] via SUBCUTANEOUS
  Administered 2020-08-04: 8 [IU] via SUBCUTANEOUS
  Administered 2020-08-04: 3 [IU] via SUBCUTANEOUS

## 2020-07-31 MED ORDER — INSULIN ASPART 100 UNIT/ML ~~LOC~~ SOLN
0.0000 [IU] | Freq: Every day | SUBCUTANEOUS | Status: DC
  Administered 2020-08-01 – 2020-08-02 (×2): 2 [IU] via SUBCUTANEOUS
  Administered 2020-08-03: 3 [IU] via SUBCUTANEOUS

## 2020-07-31 MED ORDER — GLUCERNA SHAKE PO LIQD
237.0000 mL | Freq: Two times a day (BID) | ORAL | Status: DC
  Administered 2020-07-31 – 2020-08-01 (×2): 237 mL via ORAL
  Filled 2020-07-31 (×3): qty 237

## 2020-07-31 NOTE — Evaluation (Signed)
Physical Therapy Evaluation Patient Details Name: Taylor Banks MRN: [id number] DOB: 1946-09-01 Today's Date: 07/31/2020   History of Present Illness  74 y.o female admitted on 07/29/20 for back pain, cough, fatiuge.  Pt dx with COVID-19  7 days PTA.  Hospital dx inculde acute hypoxic respiratory failure due to COVID 19 PNA, requiring supplemental O2, elevated D-dimer with (+) L LE DVT, CT of chest negative for PE, hyponatremia, transaminitis, hyperglycemia, and AKI.  Pt with significant PMH of HTN, cervical CA s/p hysterectomy, carotid artery occlusion s/p L CEA, obesity.    Clinical Impression  Pt was able to walk around the room with support from the RW (did not use an assistive device PTA) and requiring up to 6L O2 [place] during mobility to maintain sats in the low to mid 80s (see separate O2 sat note).  RR up to the low 40s, however, recovers in <3 mins with seated rest and pursed lip breathing.  Pt is generally oriented, but quite lethargic, at times slow to process and forgetful.  She has support at home from her husband and a friend who is an Therapist, sports, so I anticipate she will be able to go home at discharge with their support.   PT to follow acutely for deficits listed below.  Bring printed LE/UE HEP next session.    Follow Up Recommendations Home health PT    Equipment Recommendations  Rolling walker with 5" wheels;Other (comment) (possible home O2)    Recommendations for Other Services OT consult     Precautions / Restrictions Precautions Precautions: Fall;Other (comment) Precaution Comments: monitor O2 sats      Mobility  Bed Mobility Overal bed mobility: Needs Assistance Bed Mobility: Supine to Sit     Supine to sit: Modified independent (Device/Increase time);HOB elevated     General bed mobility comments: HOB mildly elevated, pt needing extra time to complete and relying on railing for support to come up to sitting EOB.   Transfers Overall transfer level: Needs  assistance Equipment used: Rolling walker (2 wheeled);1 person hand held assist Transfers: Sit to/from Stand Sit to Stand: Min assist;Min guard         General transfer comment: Min assist to come to standing with one person hand held assist, quickly switced to RW for support and to help with endurance/activity tolerance. Min guard assist for safety and balance during transitions with device, min assist without device.   Ambulation/Gait Ambulation/Gait assistance: Min guard;Min assist Gait Distance (Feet): 15 Feet (x2 with seated rest break in the bathroom on the toilet. ) Assistive device: Rolling walker (2 wheeled) Gait Pattern/deviations: Step-through pattern;Shuffle     General Gait Details: Pt with slow, shuffling gait, cues for safe RW use (stay inside the handles, but not too close to the front, feet inside when turning).  O2 sats maintained in the low to mid 80s even when O2 turned up to 6L during mobility (started at 2).  DOE 3/4 with RR as high as low 40s.  Pt recovers quickly with seated rest (less than 3 mins, cues for pursed lip breathing).           Balance Overall balance assessment: Needs assistance Sitting-balance support: Feet supported;Bilateral upper extremity supported Sitting balance-Leahy Scale: Poor Sitting balance - Comments: leaning heavily on arms in sitting   Standing balance support: Single extremity supported;Bilateral upper extremity supported Standing balance-Leahy Scale: Poor Standing balance comment: needs external support in standing from therapist and/or RW, was able to preform peri care, manage  own underpants and wash hands at sink with min guard assist.                              Pertinent Vitals/Pain Pain Assessment: No/denies pain Pain Location: Per RN report she was having back pain earlier, was premedicated prior to our session    [place] expects to be discharged to:: Private residence Living  Arrangements: Spouse/significant other (husband is also sick) Available Help at Discharge: Family;Available 24 hours/day;Friend(s);Other (Comment) (has a friend Therapist, sports who has volunteered to help) Type of Home: House Home Access: Stairs to enter Entrance Stairs-Rails: None Entrance Stairs-Number of Steps: 3 Home Layout: Two level;Able to live on main level with bedroom/bathroom Home Equipment: Walker - standard;Cane - single point Additional Comments: 3 kids locally, no falls, no pets, 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    Communication   Communication: No difficulties  Cognition Arousal/Alertness: Lethargic Behavior During Therapy: Flat affect Overall Cognitive Status: Impaired/Different from baseline Area of Impairment: Problem solving;Memory;Attention                   Current Attention Level: Selective Memory: Decreased short-term memory       Problem Solving: Slow processing General Comments: Pt looks very fatigued, slow to process, slow to move, at times needs re-attention/re arousal as she looked to almost be dozing off in the bed.  Cannot remember detail (how long have you felt sick?--I don't know), Generally A and O x4 however she did report 2022 and when questioned switched to 2021.        General Comments      Exercises Other Exercises Other Exercises: incentive spirometer x 5 reps cues for technique, max inspired  volume 750 mL, flutter valve x 10 each. Education re: preforming hourly.    Assessment/Plan    PT Assessment Patient needs continued PT services  PT Problem List Decreased strength;Decreased activity tolerance;Decreased balance;Decreased mobility;Decreased cognition;Decreased knowledge of use of DME;Cardiopulmonary status limiting activity;Decreased safety awareness;Decreased knowledge of precautions;Obesity       PT Treatment Interventions DME instruction;Gait training;Functional mobility training;Stair training;Therapeutic activities;Therapeutic exercise;Balance training;Cognitive remediation;Patient/family education    PT Goals (Current goals can be found in the Care Plan section)  Acute Rehab PT Goals Patient Stated Goal: pt wants to go home at discharge PT Goal Formulation: With patient Time For Goal Achievement: 08/14/20 Potential to Achieve Goals: Good    Frequency Min 3X/week           AM-PAC PT "6 Clicks" Mobility  Outcome Measure Help needed turning from your back to your side while in a flat bed without using bedrails?: A Little Help needed moving from lying on your back to sitting on the side of a flat bed without using bedrails?: A Little Help needed moving to and from a bed to a chair (including a wheelchair)?: A Little Help needed standing  up from a chair using your arms (e.g., wheelchair or bedside chair)?: A Little Help needed to walk in hospital room?: A Little Help needed climbing 3-5 steps with a railing? : A Little 6 Click Score: 18    End of Session Equipment Utilized During Treatment: Gait belt;Oxygen (2-6L) Activity Tolerance: Patient limited by fatigue Patient left: in chair;with call bell/phone within reach;with chair alarm set Nurse Communication: Mobility status, left on 5 L O2 [place] due to in the low 90s still, wean in room later if able. PT Visit Diagnosis: Muscle weakness (generalized) (M62.81);Difficulty in walking, not elsewhere classified  (R26.2)    Time: 6384-5364 PT Time Calculation (min) (ACUTE ONLY): 41 min   Charges:         Verdene Lennert, PT, DPT  Acute Rehabilitation [phone] pager #[phone] office      PT Evaluation $PT Eval Moderate Complexity: 1 Mod PT Treatments $Gait Training: 8-22 mins $Therapeutic Activity: 8-22 mins

## 2020-07-31 NOTE — TOC Initial Note (Addendum)
Transition of Care [facility]) - Initial/Assessment Note    Patient Details  Name: Taylor Banks MRN: [id number] Date of Birth: 28-Feb-1946  [facility] CM/SW Contact:    Carles Collet, RN Phone Number: 07/31/2020, 4:50 PM  Clinical Narrative:                Damaris Schooner w patient over the phone. She lives with spouse at home. Uses RW at home. Is agreeable to any 4 or 5 star rated [place] agency. Spoke w HTA Concierge, [place], [facility] are in network. [facility]- declined [facility]- declined [facility]- able to accept for PT OT only   Will continue to follow for O2 needs   Expected Discharge Plan: [place] Barriers to Discharge: Continued Medical Work up   Patient Goals and CMS Choice        Expected Discharge Plan and Services Expected Discharge Plan: [place] Arranged: PT, OT          Prior Living Arrangements/Services   Lives with:: Spouse                   Activities of Daily Living Home Assistive Devices/Equipment: None ADL Screening (condition at time of admission) Patient's cognitive ability adequate to safely complete daily activities?: Yes Is the patient deaf or have difficulty hearing?: No Does the patient have difficulty seeing, even when wearing glasses/contacts?: No Does the patient have difficulty concentrating, remembering, or making decisions?: No Patient able to express need for assistance with ADLs?: Yes Does the patient have difficulty dressing or bathing?: No Independently performs ADLs?: Yes (appropriate for developmental age) Does the patient have difficulty walking or climbing stairs?: No Weakness of Legs: None Weakness of Arms/Hands: None  Permission Sought/Granted                  Emotional Assessment              Admission diagnosis:  Hypoxia [R09.02] Fatigue, unspecified type [R53.83] Pneumonia due to COVID-19 virus [U07.1,  J12.82] COVID-19 [U07.1] Patient Active Problem List   Diagnosis Date Noted   Pneumonia due to COVID-19 virus 07/29/2020   Hypertension    Asymptomatic carotid artery stenosis, right 09/09/2017   Aftercare following surgery of the circulatory system, NEC 02/01/2014   Occlusion and stenosis of carotid artery without mention of cerebral infarction 02/01/2013   PCP:  Redmond School, MD Pharmacy:   [place], [place] [address] [place] [address] Phone: [phone] Fax: [phone]     Social Determinants of Health (SDOH) Interventions    Readmission Risk Interventions No flowsheet data found.

## 2020-07-31 NOTE — Progress Notes (Signed)
SATURATION QUALIFICATIONS: (This note is used to comply with regulatory documentation for home oxygen)  Patient Saturations on Room Air at Rest = 83% (seated EOB)  Patient Saturations on Room Air while Ambulating = NT due to sats in the low 80s at rest%  Patient Saturations on 2, 4, and then ultimately 6 Liters of oxygen while Ambulating = on 6L she continued to only maintain sats 82%, DOE 3/4 and RR 42.    Please briefly explain why patient needs home oxygen: Pt desaturates on RA at rest and continues to desaturate with supplemental O2 during mobility.   Verdene Lennert, PT, DPT  Acute Rehabilitation [phone] pager [phone] office

## 2020-07-31 NOTE — Progress Notes (Signed)
PROGRESS NOTE                                                                                                                                                                                                             Patient Demographics:    Taylor Banks, is a 74 y.o. female, DOB - 12/18/1946, LPF:[id number]  Admit date - 07/29/2020   Admitting Physician Vashti Hey, MD  Outpatient Primary MD for the patient is Redmond School, MD  LOS - 2   Chief Complaint  Patient presents with  . Fatigue  . Back Pain  . Cough       Brief Narrative     AZRIELLE Banks is an 74 y.o. female with PMH significant for HTN and cervical cancer status post hysterectomy was in her usual state of health until last week when her husband was diagnosed with Covid infection when he was routinely tested at a hospital when he presented for treatment of hypoglycemia.  Patient decided to get herself tested at the time and was also noted to be +7 days ago.  Since that time patient states she has been progressively feeling worse she is not sure how.  Patient states that her appetite is okay and that she has been urinating sufficiently.  She does admit to feeling tired but denies overt shortness of breath.  Patient denies that she is confused.  When asked why she kept taking her oxygen off she stated "I cannot understand anybody with all the stuff on my face".  Note she does not like having a mask on her face.  However states that she knows she is at [facility] for treatment of Covid.  Her work-up was significant for hypoxia requiring oxygen, hyponatremia secondary to dehydration.    Subjective:    [name] today has, No headache, No chest pain, No abdominal pain , she does report dyspnea, cough, as well she does report generalized weakness, and very poor appetite.    Assessment  & Plan :    Principal Problem:   Pneumonia due to COVID-19 virus Active  Problems:   Hypertension   Principal Problem:   Pneumonia due to COVID-19 virus Active Problems:   Hypertension   Acute hypoxic respiratory failure due to COVID-19 pneumonia . -Patient with hypoxia did drop to 85% on room air, she is  still requiring 2 L nasal cannula this morning . -Continue with Decadron . -Continue with IV remdesivir . -He was encouraged to get out of bed to chair, use incentive spirometry, and flutter valve . -She has consented to Actemra if indicated, denies any history of hepatitis, TB, or diverticulitis no active malignancy (cervical cancer more than 30 years ago) , so far no indication given she is on 2 L nasal cannula, will monitor closely . -Continue to trend inflammatory markers closely  Evaded D-dimers/acute DVT -CTA chest negative for PE. -Venous Doppler significant for DVT and left peroneal vein-this is small vein below the knee, given her Covid status, poor ambulatory status, will go ahead with full anticoagulation for which she will need for 77-month, she is currently on Lovenox, will switch to Eliquis once stable.  Hyponatremia -Due to volume depletion and dehydration, improving with IV fluids.  Transaminitis -Due to Covid, continue to monitor.  Hyperglycemia -A1c of 7, will start on sliding scale specially she is on steroids.  AKI Likely secondary to intravascular volume depletion and direct Covid infection -Continue with IV fluids  HTN Hold diltiazem given relatively low blood pressures and likely intravascular fluid depletion at present  COVID-19 Labs  Recent Labs    07/29/20 1905 07/30/20 0355 07/31/20 0500  DDIMER 3.37*  --  2.34*  CRP 11.9* 14.2* 8.8*    Lab Results  Component Value Date   SARSCOV2NAA Detected (A) 07/22/2020   [place] NEGATIVE 11/08/2019   [place] NEGATIVE 10/20/2019     Code Status : Full  Family Communication  : I have updated her husband by phone  Disposition Plan  :  Status is:  Inpatient  Remains inpatient appropriate because:IV treatments appropriate due to intensity of illness or inability to take PO and Inpatient level of care appropriate due to severity of illness   Dispo: The patient is from: Home              Anticipated d/c is to: Home              Anticipated d/c date is: 3 days              Patient currently is not medically stable to d/c.     Consults  :  None   Procedures  : None  DVT Prophylaxis  :  [place] lovenopx  Lab Results  Component Value Date   PLT 253 07/31/2020    Antibiotics  :    Anti-infectives (From admission, onward)   Start     Dose/Rate Route Frequency Ordered Stop   07/30/20 1000  remdesivir 100 mg in sodium chloride 0.9 % 100 mL IVPB     Discontinue    "Followed by" Linked Group Details   100 mg 200 mL/hr over 30 Minutes Intravenous Daily 07/29/20 1640 08/03/20 0959   07/29/20 1645  remdesivir 200 mg in sodium chloride 0.9% 250 mL IVPB       "Followed by" Linked Group Details   200 mg 580 mL/hr over 30 Minutes Intravenous Once 07/29/20 1640 07/29/20 1858        Objective:   Vitals:   07/30/20 1500 07/30/20 2000 07/31/20 0429 07/31/20 0700  BP: 126/64 (!) 129/58 (!) 127/52   Pulse: 68 62 64   Resp: 20 (!) 22 (!) 21 (!) 25  Temp: (!) 97.4 F (36.3 C) 97.8 F (36.6 C) 97.9 F (36.6 C)   TempSrc: Oral Oral Oral   SpO2: 91% 93% 96%   Weight:  Height:        Wt Readings from Last 3 Encounters:  07/29/20 68.2 kg  10/24/19 83.9 kg  04/06/18 76.9 kg     Intake/Output Summary (Last 24 hours) at 07/31/2020 1051 Last data filed at 07/30/2020 2000 Gross per 24 hour  Intake 441.67 ml  Output --  Net 441.67 ml     Physical Exam  Awake Alert, Oriented X 3, Extremly frail, no new F.N deficits, Normal affect Symmetrical Chest wall movement, Good air movement bilaterally, CTAB RRR,No Gallops,Rubs or new Murmurs, No Parasternal Heave +ve B.Sounds, Abd Soft, No tenderness, No rebound - guarding or  rigidity. No Cyanosis, Clubbing or edema, No new Rash or bruise       Data Review:    CBC Recent Labs  Lab 07/29/20 1234 07/29/20 1905 07/30/20 0355 07/31/20 0500  WBC 7.0 6.1 5.2 8.4  HGB 14.7 13.0 13.4 14.4  HCT 42.8 38.0 38.7 40.7  PLT 186 175 173 253  MCV 90.9 90.3 90.4 90.0  MCH 31.2 30.9 31.3 31.9  MCHC 34.3 34.2 34.6 35.4  RDW 12.3 12.3 12.3 12.3  LYMPHSABS  --   --  0.6* 0.9  MONOABS  --   --  0.3 0.6  EOSABS  --   --  0.0 0.0  BASOSABS  --   --  0.0 0.0    Chemistries  Recent Labs  Lab 07/29/20 1234 07/29/20 1905 07/30/20 0355 07/31/20 0500  NA 126*  --  130* 132*  K 4.2  --  4.1 3.8  CL 91*  --  96* 98  CO2 22  --  23 23  GLUCOSE 180*  --  199* 219*  BUN 34*  --  39* 38*  CREATININE 1.29* 1.38* 1.31* 1.02*  CALCIUM 8.1*  --  8.0* 8.4*  AST  --   --  69* 48*  ALT  --   --  52* 52*  ALKPHOS  --   --  43 47  BILITOT  --   --  1.4* 1.1   ------------------------------------------------------------------------------------------------------------------ No results for input(s): CHOL, HDL, LDLCALC, TRIG, CHOLHDL, LDLDIRECT in the last 72 hours.  Lab Results  Component Value Date   HGBA1C 7.1 (H) 07/31/2020   ------------------------------------------------------------------------------------------------------------------ No results for input(s): TSH, T4TOTAL, T3FREE, THYROIDAB in the last 72 hours.  Invalid input(s): FREET3 ------------------------------------------------------------------------------------------------------------------ No results for input(s): VITAMINB12, FOLATE, FERRITIN, TIBC, IRON, RETICCTPCT in the last 72 hours.  Coagulation profile No results for input(s): INR, PROTIME in the last 168 hours.  Recent Labs    07/29/20 1905 07/31/20 0500  DDIMER 3.37* 2.34*    Cardiac Enzymes No results for input(s): CKMB, TROPONINI, MYOGLOBIN in the last 168 hours.  Invalid input(s):  CK ------------------------------------------------------------------------------------------------------------------ No results found for: BNP  Inpatient Medications  Scheduled Meds: . benzonatate  100 mg Oral Q8H  . dexamethasone (DECADRON) injection  6 mg Intravenous Q24H  . enoxaparin (LOVENOX) injection  70 mg Subcutaneous Q12H  . feeding supplement (ENSURE ENLIVE)  237 mL Oral BID BM  . insulin aspart  0-15 Units Subcutaneous TID WC  . insulin aspart  0-5 Units Subcutaneous QHS  . pantoprazole  40 mg Oral Daily  . sodium chloride flush  3 mL Intravenous Once   Continuous Infusions: . sodium chloride 50 mL/hr at 07/30/20 1310  . remdesivir 100 mg in NS 100 mL 100 mg (07/31/20 0844)   PRN Meds:.acetaminophen, polyethylene glycol, traMADol  Micro Results Recent Results (from the past 240 hour(s))  Novel Coronavirus,  NAA ([facility])     Status: Abnormal   Collection Time: 07/22/20  3:22 PM   Specimen: Nasopharyngeal Swab; Nasopharyngeal(NP) swabs in vial transport medium   Nasopharynge  Patient  Result Value Ref Range Status   SARS-CoV-2, NAA Detected (A) Not Detected Final    Comment: Patients who have a positive COVID-19 test result may now have treatment options. Treatment options are available for patients with mild to moderate symptoms and for hospitalized patients. Visit our website at [email or web address] for resources and information. This nucleic acid amplification test was developed and its performance characteristics determined by [employer]. Nucleic acid amplification tests include RT-PCR and TMA. This test has not been FDA cleared or approved. This test has been authorized by FDA under an Emergency Use Authorization (EUA). This test is only authorized for the duration of time the declaration that circumstances exist justifying the authorization of the emergency use of in vitro diagnostic tests for detection of SARS-CoV-2 virus and/or  diagnosis of COVID-19 infection under section 564(b)(1) of the Act, 21 U.S.C. 093OIZ-1(I) (1), unless the authorization is terminated or revoked sooner. When diagnostic testing is negativ e, the possibility of a false negative result should be considered in the context of a patient's recent exposures and the presence of clinical signs and symptoms consistent with COVID-19. An individual without symptoms of COVID-19 and who is not shedding SARS-CoV-2 virus would expect to have a negative (not detected) result in this assay.   SARS-COV-2, NAA 2 DAY TAT     Status: None   Collection Time: 07/22/20  3:22 PM   Nasopharynge  Patient  Result Value Ref Range Status   SARS-CoV-2, NAA 2 DAY TAT Performed  Final    Radiology Reports CT ANGIO CHEST PE W OR WO CONTRAST  Result Date: 07/29/2020 CLINICAL DATA:  PE suspected, high prob COVID positive. EXAM: CT ANGIOGRAPHY CHEST WITH CONTRAST TECHNIQUE: Multidetector CT imaging of the chest was performed using the standard protocol during bolus administration of intravenous contrast. Multiplanar CT image reconstructions and MIPs were obtained to evaluate the vascular anatomy. CONTRAST:  48mL OMNIPAQUE IOHEXOL 350 MG/ML SOLN COMPARISON:  Chest radiograph earlier this day. FINDINGS: Cardiovascular: There are no filling defects within the pulmonary arteries to suggest pulmonary embolus. Aortic atherosclerosis without aneurysm. There are coronary artery calcifications. Heart is normal in size. No pericardial effusion. Mediastinum/Nodes: Mildly enlarged subcarinal and right hilar lymph nodes. No visualized thyroid nodule. No esophageal wall thickening. Lungs/Pleura: Diffuse bilateral ground-glass opacities in the peripheral and slight basilar predominant distribution. Parenchymal involvement is moderate to advanced. There is no pleural fluid. No pneumothorax. Upper Abdomen: No acute or unexpected findings. Musculoskeletal: Degenerative disc disease in the mid lower  spine. There are no acute or suspicious osseous abnormalities. Review of the MIP images confirms the above findings. IMPRESSION: 1. No pulmonary embolus. 2. Diffuse bilateral ground-glass opacities consistent with COVID-19 pneumonia. Moderate to advanced parenchymal involvement. 3. Mildly enlarged subcarinal and right hilar lymph nodes are likely reactive. 4. Coronary artery calcifications. Aortic Atherosclerosis (ICD10-I70.0). Electronically Signed   By: Keith Rake M.D.   On: 07/29/2020 22:50   DG Chest Portable 1 View  Result Date: 07/29/2020 CLINICAL DATA:  COVID positive.  Hypoxia. EXAM: PORTABLE CHEST 1 VIEW COMPARISON:  08/17/2007 FINDINGS: 1338 hours. Low lung volumes. The cardiopericardial silhouette is within normal limits for size. Patchy areas of peripheral ground-glass attenuation are noted in the lungs bilaterally with mid and lower lung predominance. No associated pleural effusion. The visualized bony structures of the  thorax show now acute abnormality. Telemetry leads overlie the chest. IMPRESSION: Patchy areas of peripheral ground-glass opacity, compatible with the reported history of COVID infection. Electronically Signed   By: Misty Stanley M.D.   On: 07/29/2020 14:09   VAS [place] LOWER EXTREMITY VENOUS (DVT)  Result Date: 07/30/2020  Lower Venous DVT Study Indications: Covid-19, elevated D-dimer.  Comparison Study: No prior study on file Performing Technologist: Sharion Dove RVS  Examination Guidelines: A complete evaluation includes B-mode imaging, spectral Doppler, color Doppler, and power Doppler as needed of all accessible portions of each vessel. Bilateral testing is considered an integral part of a complete examination. Limited examinations for reoccurring indications may be performed as noted. The reflux portion of the exam is performed with the patient in reverse Trendelenburg.  +---------+---------------+---------+-----------+----------+--------------+ RIGHT     CompressibilityPhasicitySpontaneityPropertiesThrombus Aging +---------+---------------+---------+-----------+----------+--------------+ CFV      Full           Yes      Yes                                 +---------+---------------+---------+-----------+----------+--------------+ SFJ      Full                                                        +---------+---------------+---------+-----------+----------+--------------+ FV Prox  Full                                                        +---------+---------------+---------+-----------+----------+--------------+ FV Mid   Full                                                        +---------+---------------+---------+-----------+----------+--------------+ FV DistalFull                                                        +---------+---------------+---------+-----------+----------+--------------+ PFV      Full                                                        +---------+---------------+---------+-----------+----------+--------------+ POP      Full           Yes      Yes                                 +---------+---------------+---------+-----------+----------+--------------+ PTV      Full                                                        +---------+---------------+---------+-----------+----------+--------------+  PERO     Full                                                        +---------+---------------+---------+-----------+----------+--------------+   +---------+---------------+---------+-----------+----------+--------------+ LEFT     CompressibilityPhasicitySpontaneityPropertiesThrombus Aging +---------+---------------+---------+-----------+----------+--------------+ CFV      Full           Yes      Yes                                 +---------+---------------+---------+-----------+----------+--------------+ SFJ      Full                                                         +---------+---------------+---------+-----------+----------+--------------+ FV Prox  Full                                                        +---------+---------------+---------+-----------+----------+--------------+ FV Mid   Full                                                        +---------+---------------+---------+-----------+----------+--------------+ FV DistalFull                                                        +---------+---------------+---------+-----------+----------+--------------+ PFV      Full                                                        +---------+---------------+---------+-----------+----------+--------------+ POP      Full           Yes      Yes                                 +---------+---------------+---------+-----------+----------+--------------+ PTV      Full                                                        +---------+---------------+---------+-----------+----------+--------------+ PERO     None  Acute          +---------+---------------+---------+-----------+----------+--------------+     Summary: RIGHT: - There is no evidence of deep vein thrombosis in the lower extremity.  LEFT: - Findings consistent with acute deep vein thrombosis involving the left peroneal veins.  *See table(s) above for measurements and observations.    Preliminary      Phillips Climes M.D on 07/31/2020 at 10:51 AM    [facility] -  Office  [phone]

## 2020-08-01 DIAGNOSIS — R5383 Other fatigue: Secondary | ICD-10-CM

## 2020-08-01 DIAGNOSIS — J9601 Acute respiratory failure with hypoxia: Secondary | ICD-10-CM

## 2020-08-01 LAB — COMPREHENSIVE METABOLIC PANEL
ALT: 39 U/L (ref 0–44)
AST: 31 U/L (ref 15–41)
Albumin: 2.4 g/dL — ABNORMAL LOW (ref 3.5–5.0)
Alkaline Phosphatase: 39 U/L (ref 38–126)
Anion gap: 10 (ref 5–15)
BUN: 27 mg/dL — ABNORMAL HIGH (ref 8–23)
CO2: 24 mmol/L (ref 22–32)
Calcium: 8.1 mg/dL — ABNORMAL LOW (ref 8.9–10.3)
Chloride: 102 mmol/L (ref 98–111)
Creatinine, Ser: 0.91 mg/dL (ref 0.44–1.00)
GFR calc Af Amer: >60 mL/min (ref >60)
GFR calc non Af Amer: >60 mL/min (ref >60)
Glucose, Bld: 168 mg/dL — ABNORMAL HIGH (ref 70–99)
Potassium: 4.4 mmol/L (ref 3.5–5.1)
Sodium: 136 mmol/L (ref 135–145)
Total Bilirubin: 1.1 mg/dL (ref 0.3–1.2)
Total Protein: 5.3 g/dL — ABNORMAL LOW (ref 6.5–8.1)

## 2020-08-01 LAB — CBC WITH DIFFERENTIAL/PLATELET
Abs Immature Granulocytes: 0.05 10*3/uL (ref 0.00–0.07)
Basophils Absolute: 0 10*3/uL (ref 0.0–0.1)
Basophils Relative: 0 %
Eosinophils Absolute: 0 10*3/uL (ref 0.0–0.5)
Eosinophils Relative: 0 %
HCT: 37.4 % (ref 36.0–46.0)
Hemoglobin: 13 g/dL (ref 12.0–15.0)
Immature Granulocytes: 1 %
Lymphocytes Relative: 11 %
Lymphs Abs: 0.6 10*3/uL — ABNORMAL LOW (ref 0.7–4.0)
MCH: 31.4 pg (ref 26.0–34.0)
MCHC: 34.8 g/dL (ref 30.0–36.0)
MCV: 90.3 fL (ref 80.0–100.0)
Monocytes Absolute: 0.4 10*3/uL (ref 0.1–1.0)
Monocytes Relative: 7 %
Neutro Abs: 4.1 10*3/uL (ref 1.7–7.7)
Neutrophils Relative %: 81 %
Platelets: 260 10*3/uL (ref 150–400)
RBC: 4.14 MIL/uL (ref 3.87–5.11)
RDW: 12.5 % (ref 11.5–15.5)
WBC: 5.1 10*3/uL (ref 4.0–10.5)
nRBC: 0 % (ref 0.0–0.2)

## 2020-08-01 LAB — D-DIMER, QUANTITATIVE: D-Dimer, Quant: 1.54 ug/mL-FEU — ABNORMAL HIGH (ref 0.00–0.50)

## 2020-08-01 LAB — GLUCOSE, CAPILLARY
Glucose-Capillary: 137 mg/dL — ABNORMAL HIGH (ref 70–99)
Glucose-Capillary: 115 mg/dL — ABNORMAL HIGH (ref 70–99)
Glucose-Capillary: 103 mg/dL — ABNORMAL HIGH (ref 70–99)
Glucose-Capillary: 232 mg/dL — ABNORMAL HIGH (ref 70–99)

## 2020-08-01 LAB — C-REACTIVE PROTEIN: CRP: 11.3 mg/dL — ABNORMAL HIGH (ref <1.0)

## 2020-08-01 MED ORDER — DILTIAZEM HCL 60 MG PO TABS
30.0000 mg | ORAL_TABLET | Freq: Four times a day (QID) | ORAL | Status: DC
  Administered 2020-08-01 – 2020-08-02 (×2): 30 mg via ORAL
  Filled 2020-08-01 (×3): qty 1

## 2020-08-01 MED ORDER — GLUCERNA SHAKE PO LIQD
237.0000 mL | Freq: Two times a day (BID) | ORAL | Status: DC
  Administered 2020-08-01 – 2020-08-04 (×6): 237 mL via ORAL

## 2020-08-01 NOTE — Care Management (Signed)
Benefit check sent for  apixaban 5mg  twice daily. Patient will need 30 day coupon at time of DC, or med filled by [facility] pharmacy M-F.

## 2020-08-01 NOTE — Progress Notes (Signed)
Occupational Therapy Evaluation Patient Details Name: Taylor Banks MRN: [id number] DOB: 1946-06-17 Today's Date: 08/01/2020    History of Present Illness 74 y.o female admitted on 07/29/20 for back pain, cough, fatiuge.  Pt dx with COVID-19  7 days PTA.  Hospital dx inculde acute hypoxic respiratory failure due to COVID 19 PNA, requiring supplemental O2, elevated D-dimer with (+) L LE DVT, CT of chest negative for PE, hyponatremia, transaminitis, hyperglycemia, and AKI.  Pt with significant PMH of HTN, cervical CA s/p hysterectomy, carotid artery occlusion s/p L CEA, obesity.     Clinical Impression   Patient lives with husband and is independent at prior level of function.  Presents today with decreased activity tolerance, strength, balance, and cognition.   Processing and response time is decreased and patient moves very slowly.  Required min guard with stand and one hand assist for walk as she is mildly unsteady, no LOB.  Patient started session on 4L O2 [place] with resting SpO2 at 84.  Switch to HFNC and bumped to 6L for mobility.  Patient able to walk ~53ft to sink and SpO2 decreased to 74, O2 bumped to 8L.  After 5 min seated rest attempted to stand for oral care. After 1 min stand patient needing to sit, SpO2 decrease to 70.  Lengthy period of time for seated rest and pursed lip breathing, O2 10L.  Ended session in recliner on 6L HFNC, resting SpO2 90.  Will continue to follow with OT acutely to address the deficits listed below.    Follow Up Recommendations  Home health OT;Supervision - Intermittent    Equipment Recommendations  3 in 1 bedside commode    Recommendations for Other Services       Precautions / Restrictions Precautions Precautions: Fall;Other (comment) Precaution Comments: monitor O2 sats Restrictions Weight Bearing Restrictions: No      Mobility Bed Mobility Overal bed mobility: Needs Assistance             General bed mobility comments: OOB in  chair  Transfers Overall transfer level: Needs assistance Equipment used: 1 person hand held assist Transfers: Sit to/from Stand Sit to Stand: Min guard              Balance Overall balance assessment: Needs assistance Sitting-balance support: Feet supported;Bilateral upper extremity supported Sitting balance-Leahy Scale: Poor Sitting balance - Comments: leaning heavily on arms in sitting   Standing balance support: Single extremity supported;Bilateral upper extremity supported Standing balance-Leahy Scale: Poor Standing balance comment: Leaning on sink and using one hand support of therapist for walk                           ADL either performed or assessed with clinical judgement   ADL Overall ADL's : Needs assistance/impaired Eating/Feeding: Independent;Sitting   Grooming: Min guard;Sitting;Set up Grooming Details (indicate cue type and reason): Not able to tolerate standing for grooming due to O2 need and poor activity tolerance Upper Body Bathing: Minimal assistance;Sitting   Lower Body Bathing: Minimal assistance;Sitting/lateral leans;Sit to/from stand   Upper Body Dressing : Set up;Supervision/safety;Sitting   Lower Body Dressing: Minimal assistance;Sitting/lateral leans;Sit to/from stand   Toilet Transfer: Min guard;Ambulation;BSC;Regular Glass blower/designer Details (indicate cue type and reason): Physically abel to ambulate slwoly to bathroom though BSC commode better at this time because of desat with mobility Toileting- Clothing Manipulation and Hygiene: Set up;Supervision/safety;Sitting/lateral lean       Functional mobility during ADLs: Min guard General  ADL Comments: O2 demand and poor activity toolerance is biggest limitation     Vision         Perception     Praxis      Pertinent Vitals/Pain Pain Assessment: No/denies pain Pain Location: Prememdicated for chronic back pain     Hand Dominance Right   Extremity/Trunk  Assessment Upper Extremity Assessment Upper Extremity Assessment: Generalized weakness   Lower Extremity Assessment Lower Extremity Assessment: Generalized weakness       Communication Communication Communication: No difficulties   Cognition Arousal/Alertness: Lethargic Behavior During Therapy: Flat affect Overall Cognitive Status: Impaired/Different from baseline Area of Impairment: Memory;Following commands;Awareness;Problem solving;Safety/judgement                   Current Attention Level: Selective Memory: Decreased short-term memory Following Commands: Follows one step commands consistently;Follows one step commands with increased time Safety/Judgement: Decreased awareness of deficits Awareness: Emergent Problem Solving: Slow processing;Decreased initiation General Comments: Slow processing and slow movement. Generally oriented though takes a while to come up with answers.  Very flat, does not seem aware of deficits.  States she doesn't feel SOB though is visably SOB   General Comments  Patient left on 6L O2 via HFNC, SpO2 89    Exercises     Shoulder Instructions      Home Living Family/patient expects to be discharged to:: Private residence Living Arrangements: Spouse/significant other (husband also covid but doing better) Available Help at Discharge: Family;Available 24 hours/day;Friend(s);Other (Comment) (friend is RN who is going to hgelp) Type of Home: House Home Access: Stairs to enter [employer] of Steps: 3 Entrance Stairs-Rails: None Home Layout: Two level;Able to live on main level with bedroom/bathroom     Bathroom Shower/Tub: Tub/shower unit   Bathroom Toilet: Handicapped height     Home Equipment: Environmental consultant - standard;Cane - single point   Additional Comments: 3 kids locally, no falls, no pets, 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, drives      Prior Functioning/Environment Level of Independence: Independent         Comments: kids mow the yard, pt cleans the house        OT Problem List: Decreased strength;Impaired balance (sitting and/or standing);Decreased activity tolerance;Decreased cognition;Decreased safety awareness;Decreased knowledge of precautions;Decreased knowledge of use of DME or AE;Cardiopulmonary status limiting activity      OT Treatment/Interventions: Self-care/ADL training;Therapeutic exercise;Energy conservation;DME and/or AE instruction;Therapeutic activities;Cognitive remediation/compensation;Patient/family education;Balance training    OT Goals(Current goals can be found in the care plan section) Acute Rehab OT Goals Patient Stated Goal: pt wants to go home at discharge OT Goal Formulation: With patient Time For Goal Achievement: 08/15/20 Potential to Achieve Goals: Good  OT Frequency: Min 2X/week   Barriers to D/C:            Co-evaluation              AM-PAC OT "6 Clicks" Daily Activity     Outcome Measure Help from another person eating meals?: None Help from another person taking care of personal grooming?: A Little Help from another person toileting, which includes using toliet, bedpan, or urinal?: A Little Help from another person bathing (including washing, rinsing, drying)?: A Little Help from another person to put on and taking off regular upper body clothing?: A Little Help from another person to put on and taking off regular lower body clothing?: A Little 6 Click Score: 19   End of Session Equipment Utilized During Treatment: Oxygen Nurse Communication:  Mobility status  Activity Tolerance: Patient tolerated treatment well;Patient limited by lethargy Patient left: in chair;with call bell/phone within reach;with chair alarm set  OT Visit Diagnosis: Unsteadiness on feet (R26.81);Other symptoms and signs involving cognitive function;Other (comment) (decreased cardiopulminary status)                Time: 2767-0110 OT Time Calculation (min): 53  min Charges:  OT General Charges $OT Visit: 1 Visit OT Evaluation $OT Eval Moderate Complexity: 1 Mod OT Treatments $Self Care/Home Management : 23-37 mins $Therapeutic Exercise: 8-22 mins  August Luz, OTR/L   Phylliss Bob 08/01/2020, 10:09 AM

## 2020-08-01 NOTE — Progress Notes (Signed)
PROGRESS NOTE    Taylor Banks  NTI:[id number] DOB: 05-Oct-1946 DOA: 07/29/2020 PCP: Redmond School, MD     Brief Narrative:  74 y.o.WF PMHx HTN and cervical cancer status post hysterectomy   Last week her husband was diagnosed with Covid infection when he was routinely tested at a hospital when he presented for treatment of hypoglycemia. Patient decided to get herself tested at the time and was also noted to be +7 days ago. Since that time patient states she has been progressively feeling worse she is not sure how. Patient states that her appetite is okay and that she has been urinating sufficiently. She does admit to feeling tired but denies overt shortness of breath. Patient denies that she is confused. When asked why she kept taking her oxygen off she stated "I cannot understand anybody with all the stuff on my face". Note she does not like having a mask on her face. However states that she knows she is at [facility] for treatment of Covid.  Her work-up was significant for hypoxia requiring oxygen, hyponatremia secondary to dehydration.    Subjective: A/O x4, very weak, positive S OB, negative CP, negative nausea, negative vomiting, sitting in chair for the first time   Assessment & Plan: Covid vaccination; negative vaccination   Principal Problem:   Pneumonia due to COVID-19 virus Active Problems:   Hypertension   Acute hypoxic respiratory failure due to COVID-19 pneumonia . COVID-19 Labs  Recent Labs    07/29/20 1905 07/29/20 1905 07/30/20 0355 07/31/20 0500 08/01/20 0544  DDIMER 3.37*  --   --  2.34* 1.54*  CRP 11.9*   < > 14.2* 8.8* 11.3*   < > = values in this interval not displayed.    Lab Results  Component Value Date   SARSCOV2NAA Detected (A) 07/22/2020   [place] NEGATIVE 11/08/2019   [place] NEGATIVE 10/20/2019  -Remdesivir -Decadron -Per EMR consistented for Actemra if indicated.  Patient's CRP continues to climb, however patient's O2  demand and SPO2 does not indicate need at this moment if there is increased need will administer Actemra.  Evaded D-dimers/Acute DVT -CTA chest negative for PE -Venous Doppler positive acute DVT and left peroneal vein-this is small vein below the knee, patient currently pain-free, but given poor ambulatory status start on full anticoagulation. -Continue Lovenox switched to Eliquis once stable will need to continue 3 to 6 months   Essential HTN -BP meds initially held secondary to hypotension restart Cardizem at a lower dose -Cardizem 30 mg TID  Hyponatremia -Resolved  Transaminitis -Secondary to Covid -Resolved  Diabetes type 2 uncontrolled without complication -8/3 hemoglobin A1c= 7.1 -Moderate SSI  AKI Recent Labs  Lab 07/29/20 1234 07/29/20 1905 07/30/20 0355 07/31/20 0500 08/01/20 0544  CREATININE 1.29* 1.38* 1.31* 1.02* 0.91  -Resolved      DVT prophylaxis: Lovenox Code Status: Full Family Communication:  Status is: Inpatient    Dispo: The patient is from: Home              Anticipated d/c is to:??              Anticipated d/c date is: SNF?              Patient currently unstable      Consultants:    Procedures/Significant Events:    I have personally reviewed and interpreted all radiology studies and my findings are as above.  VENTILATOR SETTINGS: Nasal cannula 8/4 Flow; 4 L/min SPO2 94%   Cultures  Antimicrobials: Anti-infectives (From admission, onward)   Start     Ordered Stop   07/30/20 1000  remdesivir 100 mg in sodium chloride 0.9 % 100 mL IVPB     Discontinue    "Followed by" Linked Group Details   07/29/20 1640 08/03/20 0959   07/29/20 1645  remdesivir 200 mg in sodium chloride 0.9% 250 mL IVPB       "Followed by" Linked Group Details   07/29/20 1640 07/29/20 1858       Devices    LINES / TUBES:      Continuous Infusions: . remdesivir 100 mg in NS 100 mL 100 mg (07/31/20 0844)     Objective: Vitals:    07/31/20 1725 07/31/20 2036 08/01/20 0436 08/01/20 0741  BP: (!) 128/55 (!) 134/59 (!) 125/46   Pulse: 77 (!) 59 60   Resp: (!) 21 20 (!) 24   Temp: 97.7 F (36.5 C) 97.8 F (36.6 C) 98.9 F (37.2 C)   TempSrc: Axillary Oral Oral   SpO2: 97% 94% 100%   Weight:    67.9 kg  Height:        Intake/Output Summary (Last 24 hours) at 08/01/2020 0848 Last data filed at 07/31/2020 2000 Gross per 24 hour  Intake 1340 ml  Output --  Net 1340 ml   Filed Weights   07/29/20 1207 07/29/20 2245 08/01/20 0741  Weight: 80 kg 68.2 kg 67.9 kg    Examination:  General: A/O x4, positive acute respiratory distress Eyes: negative scleral hemorrhage, negative anisocoria, negative icterus ENT: Negative Runny nose, negative gingival bleeding, Neck:  Negative scars, masses, torticollis, lymphadenopathy, JVD Lungs: diffuse decreased breath sounds without wheezes, positive crackles Cardiovascular: Regular rate and rhythm without murmur gallop or rub normal S1 and S2 Abdomen: negative abdominal pain, nondistended, positive soft, bowel sounds, no rebound, no ascites, no appreciable mass Extremities: No significant cyanosis, clubbing, or edema bilateral lower extremities Skin: Negative rashes, lesions, ulcers Psychiatric:  Negative depression, negative anxiety, negative fatigue, negative mania  Central nervous system:  Cranial nerves II through XII intact, tongue/uvula midline, all extremities muscle strength 5/5, sensation intact throughout, negative dysarthria, negative expressive aphasia, negative receptive aphasia.  .     Data Reviewed: Care during the described time interval was provided by me .  I have reviewed this patient's available data, including medical history, events of note, physical examination, and all test results as part of my evaluation.  CBC: Recent Labs  Lab 07/29/20 1234 07/29/20 1905 07/30/20 0355 07/31/20 0500 08/01/20 0544  WBC 7.0 6.1 5.2 8.4 5.1  NEUTROABS  --   --   4.3 6.8 4.1  HGB 14.7 13.0 13.4 14.4 13.0  HCT 42.8 38.0 38.7 40.7 37.4  MCV 90.9 90.3 90.4 90.0 90.3  PLT 186 175 173 253 573   Basic Metabolic Panel: Recent Labs  Lab 07/29/20 1234 07/29/20 1905 07/30/20 0355 07/31/20 0500 08/01/20 0544  NA 126*  --  130* 132* 136  K 4.2  --  4.1 3.8 4.4  CL 91*  --  96* 98 102  CO2 22  --  23 23 24   GLUCOSE 180*  --  199* 219* 168*  BUN 34*  --  39* 38* 27*  CREATININE 1.29* 1.38* 1.31* 1.02* 0.91  CALCIUM 8.1*  --  8.0* 8.4* 8.1*  PHOS  --   --   --  4.0  --    GFR: Estimated Creatinine Clearance: 46.7 mL/min (by C-G formula based on SCr of  0.91 mg/dL). Liver Function Tests: Recent Labs  Lab 07/30/20 0355 07/31/20 0500 08/01/20 0544  AST 69* 48* 31  ALT 52* 52* 39  ALKPHOS 43 47 39  BILITOT 1.4* 1.1 1.1  PROT 5.7* 5.8* 5.3*  ALBUMIN 2.6* 2.6* 2.4*   No results for input(s): LIPASE, AMYLASE in the last 168 hours. No results for input(s): AMMONIA in the last 168 hours. Coagulation Profile: No results for input(s): INR, PROTIME in the last 168 hours. Cardiac Enzymes: No results for input(s): CKTOTAL, CKMB, CKMBINDEX, TROPONINI in the last 168 hours. BNP (last 3 results) No results for input(s): PROBNP in the last 8760 hours. HbA1C: Recent Labs    07/29/20 1905 07/31/20 0500  HGBA1C 7.0* 7.1*   CBG: Recent Labs  Lab 07/31/20 0740 07/31/20 1219 07/31/20 1717 07/31/20 2111 08/01/20 0746  GLUCAP 186*  186* 192* 200* 196* 137*   Lipid Profile: No results for input(s): CHOL, HDL, LDLCALC, TRIG, CHOLHDL, LDLDIRECT in the last 72 hours. Thyroid Function Tests: No results for input(s): TSH, T4TOTAL, FREET4, T3FREE, THYROIDAB in the last 72 hours. Anemia Panel: No results for input(s): VITAMINB12, FOLATE, FERRITIN, TIBC, IRON, RETICCTPCT in the last 72 hours. Sepsis Labs: Recent Labs  Lab 07/29/20 1905  PROCALCITON 0.12    Recent Results (from the past 240 hour(s))  Novel Coronavirus, NAA ([facility])     Status:  Abnormal   Collection Time: 07/22/20  3:22 PM   Specimen: Nasopharyngeal Swab; Nasopharyngeal(NP) swabs in vial transport medium   Nasopharynge  Patient  Result Value Ref Range Status   SARS-CoV-2, NAA Detected (A) Not Detected Final    Comment: Patients who have a positive COVID-19 test result may now have treatment options. Treatment options are available for patients with mild to moderate symptoms and for hospitalized patients. Visit our website at [email or web address] for resources and information. This nucleic acid amplification test was developed and its performance characteristics determined by [employer]. Nucleic acid amplification tests include RT-PCR and TMA. This test has not been FDA cleared or approved. This test has been authorized by FDA under an Emergency Use Authorization (EUA). This test is only authorized for the duration of time the declaration that circumstances exist justifying the authorization of the emergency use of in vitro diagnostic tests for detection of SARS-CoV-2 virus and/or diagnosis of COVID-19 infection under section 564(b)(1) of the Act, 21 U.S.C. 017PZW-2(H) (1), unless the authorization is terminated or revoked sooner. When diagnostic testing is negativ e, the possibility of a false negative result should be considered in the context of a patient's recent exposures and the presence of clinical signs and symptoms consistent with COVID-19. An individual without symptoms of COVID-19 and who is not shedding SARS-CoV-2 virus would expect to have a negative (not detected) result in this assay.   SARS-COV-2, NAA 2 DAY TAT     Status: None   Collection Time: 07/22/20  3:22 PM   Nasopharynge  Patient  Result Value Ref Range Status   SARS-CoV-2, NAA 2 DAY TAT Performed  Final         Radiology Studies: VAS [place] LOWER EXTREMITY VENOUS (DVT)  Result Date: 07/31/2020  Lower Venous DVT Study Indications: Covid-19, elevated D-dimer.   Comparison Study: No prior study on file Performing Technologist: Sharion Dove RVS  Examination Guidelines: A complete evaluation includes B-mode imaging, spectral Doppler, color Doppler, and power Doppler as needed of all accessible portions of each vessel. Bilateral testing is considered an integral part of a complete examination. Limited examinations for  reoccurring indications may be performed as noted. The reflux portion of the exam is performed with the patient in reverse Trendelenburg.  +---------+---------------+---------+-----------+----------+--------------+ RIGHT    CompressibilityPhasicitySpontaneityPropertiesThrombus Aging +---------+---------------+---------+-----------+----------+--------------+ CFV      Full           Yes      Yes                                 +---------+---------------+---------+-----------+----------+--------------+ SFJ      Full                                                        +---------+---------------+---------+-----------+----------+--------------+ FV Prox  Full                                                        +---------+---------------+---------+-----------+----------+--------------+ FV Mid   Full                                                        +---------+---------------+---------+-----------+----------+--------------+ FV DistalFull                                                        +---------+---------------+---------+-----------+----------+--------------+ PFV      Full                                                        +---------+---------------+---------+-----------+----------+--------------+ POP      Full           Yes      Yes                                 +---------+---------------+---------+-----------+----------+--------------+ PTV      Full                                                        +---------+---------------+---------+-----------+----------+--------------+  PERO     Full                                                        +---------+---------------+---------+-----------+----------+--------------+   +---------+---------------+---------+-----------+----------+--------------+ LEFT     CompressibilityPhasicitySpontaneityPropertiesThrombus Aging +---------+---------------+---------+-----------+----------+--------------+ CFV      Full  Yes      Yes                                 +---------+---------------+---------+-----------+----------+--------------+ SFJ      Full                                                        +---------+---------------+---------+-----------+----------+--------------+ FV Prox  Full                                                        +---------+---------------+---------+-----------+----------+--------------+ FV Mid   Full                                                        +---------+---------------+---------+-----------+----------+--------------+ FV DistalFull                                                        +---------+---------------+---------+-----------+----------+--------------+ PFV      Full                                                        +---------+---------------+---------+-----------+----------+--------------+ POP      Full           Yes      Yes                                 +---------+---------------+---------+-----------+----------+--------------+ PTV      Full                                                        +---------+---------------+---------+-----------+----------+--------------+ PERO     None                                         Acute          +---------+---------------+---------+-----------+----------+--------------+     Summary: RIGHT: - There is no evidence of deep vein thrombosis in the lower extremity.  LEFT: - Findings consistent with acute deep vein thrombosis involving the left peroneal veins.  *See  table(s) above for measurements and observations. Electronically signed by Deitra Mayo MD on 07/31/2020 at 8:10:16 PM.    Final         Scheduled Meds: . benzonatate  100 mg Oral Q8H  . dexamethasone (  DECADRON) injection  6 mg Intravenous Q24H  . enoxaparin (LOVENOX) injection  70 mg Subcutaneous Q12H  . feeding supplement (GLUCERNA SHAKE)  237 mL Oral BID BM  . insulin aspart  0-15 Units Subcutaneous TID WC  . insulin aspart  0-5 Units Subcutaneous QHS  . pantoprazole  40 mg Oral Daily  . sodium chloride flush  3 mL Intravenous Once   Continuous Infusions: . remdesivir 100 mg in NS 100 mL 100 mg (07/31/20 0844)     LOS: 3 days    Time spent:40 min    Shenouda Genova, Geraldo Docker, MD [facility] Pager [phone]  If 7PM-7AM, please contact night-coverage [email or web address] Password [facility] 08/01/2020, 8:48 AM

## 2020-08-01 NOTE — TOC Benefit Eligibility Note (Signed)
Transition of Care [facility]) Benefit Eligibility Note    Patient Details  Name: Taylor Banks MRN: [id number] Date of Birth: 1946-07-03   Medication/Dose: Arne [place] 5 MG BI  Covered?: Yes  Tier: 3 Drug  Prescription Coverage Preferred [facility]: [facility]   , [place] with Person/Company/Phone Number:: BLANTA  @ [employer] # [id number] OPT- 2  Co-Pay: $45.00  Prior Approval: No  Deductible: Unmet  Additional Notes: APIXABAN : Crecencio Mc Phone Number: 08/01/2020, 12:02 PM

## 2020-08-02 LAB — CBC WITH DIFFERENTIAL/PLATELET
Abs Immature Granulocytes: 0 10*3/uL (ref 0.00–0.07)
Basophils Absolute: 0 10*3/uL (ref 0.0–0.1)
Basophils Relative: 0 %
Eosinophils Absolute: 0 10*3/uL (ref 0.0–0.5)
Eosinophils Relative: 0 %
HCT: 37.2 % (ref 36.0–46.0)
Hemoglobin: 13 g/dL (ref 12.0–15.0)
Lymphocytes Relative: 10 %
Lymphs Abs: 0.5 10*3/uL — ABNORMAL LOW (ref 0.7–4.0)
MCH: 32 pg (ref 26.0–34.0)
MCHC: 34.9 g/dL (ref 30.0–36.0)
MCV: 91.6 fL (ref 80.0–100.0)
Monocytes Absolute: 0.2 10*3/uL (ref 0.1–1.0)
Monocytes Relative: 4 %
Neutro Abs: 4 10*3/uL (ref 1.7–7.7)
Neutrophils Relative %: 86 %
Platelets: 303 10*3/uL (ref 150–400)
RBC: 4.06 MIL/uL (ref 3.87–5.11)
RDW: 12.3 % (ref 11.5–15.5)
WBC: 4.6 10*3/uL (ref 4.0–10.5)
nRBC: 0 % (ref 0.0–0.2)
nRBC: 0 /100 WBC

## 2020-08-02 LAB — COMPREHENSIVE METABOLIC PANEL
ALT: 36 U/L (ref 0–44)
AST: 31 U/L (ref 15–41)
Albumin: 2.4 g/dL — ABNORMAL LOW (ref 3.5–5.0)
Alkaline Phosphatase: 42 U/L (ref 38–126)
Anion gap: 11 (ref 5–15)
BUN: 23 mg/dL (ref 8–23)
CO2: 23 mmol/L (ref 22–32)
Calcium: 8.3 mg/dL — ABNORMAL LOW (ref 8.9–10.3)
Chloride: 100 mmol/L (ref 98–111)
Creatinine, Ser: 0.86 mg/dL (ref 0.44–1.00)
GFR calc Af Amer: >60 mL/min (ref >60)
GFR calc non Af Amer: >60 mL/min (ref >60)
Glucose, Bld: 169 mg/dL — ABNORMAL HIGH (ref 70–99)
Potassium: 4.7 mmol/L (ref 3.5–5.1)
Sodium: 134 mmol/L — ABNORMAL LOW (ref 135–145)
Total Bilirubin: 1.1 mg/dL (ref 0.3–1.2)
Total Protein: 5.6 g/dL — ABNORMAL LOW (ref 6.5–8.1)

## 2020-08-02 LAB — D-DIMER, QUANTITATIVE: D-Dimer, Quant: 1.52 ug/mL-FEU — ABNORMAL HIGH (ref 0.00–0.50)

## 2020-08-02 LAB — GLUCOSE, CAPILLARY
Glucose-Capillary: 150 mg/dL — ABNORMAL HIGH (ref 70–99)
Glucose-Capillary: 150 mg/dL — ABNORMAL HIGH (ref 70–99)
Glucose-Capillary: 129 mg/dL — ABNORMAL HIGH (ref 70–99)
Glucose-Capillary: 215 mg/dL — ABNORMAL HIGH (ref 70–99)

## 2020-08-02 LAB — C-REACTIVE PROTEIN: CRP: 8.6 mg/dL — ABNORMAL HIGH (ref <1.0)

## 2020-08-02 LAB — FERRITIN: Ferritin: 768 ng/mL — ABNORMAL HIGH (ref 11–307)

## 2020-08-02 LAB — PHOSPHORUS: Phosphorus: 4.1 mg/dL (ref 2.5–4.6)

## 2020-08-02 LAB — MAGNESIUM: Magnesium: 2.6 mg/dL — ABNORMAL HIGH (ref 1.7–2.4)

## 2020-08-02 LAB — LACTATE DEHYDROGENASE: LDH: 364 U/L — ABNORMAL HIGH (ref 98–192)

## 2020-08-02 MED ORDER — DILTIAZEM HCL 60 MG PO TABS
60.0000 mg | ORAL_TABLET | Freq: Three times a day (TID) | ORAL | Status: DC
  Administered 2020-08-02: 60 mg via ORAL
  Filled 2020-08-02 (×2): qty 1

## 2020-08-02 NOTE — Progress Notes (Signed)
PROGRESS NOTE    Taylor Banks  GHW:[id number] DOB: June 02, 1946 DOA: 07/29/2020 PCP: Redmond School, MD     Brief Narrative:  74 y.o.WF PMHx HTN and cervical cancer status post hysterectomy   Last week her husband was diagnosed with Covid infection when he was routinely tested at a hospital when he presented for treatment of hypoglycemia. Patient decided to get herself tested at the time and was also noted to be +7 days ago. Since that time patient states she has been progressively feeling worse she is not sure how. Patient states that her appetite is okay and that she has been urinating sufficiently. She does admit to feeling tired but denies overt shortness of breath. Patient denies that she is confused. When asked why she kept taking her oxygen off she stated "I cannot understand anybody with all the stuff on my face". Note she does not like having a mask on her face. However states that she knows she is at [facility] for treatment of Covid.  Her work-up was significant for hypoxia requiring oxygen, hyponatremia secondary to dehydration.    Subjective: 8/5 A/O x4, extremely weak has not been out of the bed today.  States her husband was in the hospital for 3 days with Covid he is at home by himself extremely weak unable to care for himself.  She states he definitely is unable to care for her if she were to come home.     Assessment & Plan: Covid vaccination; negative vaccination   Principal Problem:   Pneumonia due to COVID-19 virus Active Problems:   Hypertension   Acute hypoxic respiratory failure due to COVID-19 pneumonia . COVID-19 Labs  Recent Labs    07/31/20 0500 08/01/20 0544 08/02/20 0401  DDIMER 2.34* 1.54* 1.52*  FERRITIN  --   --  768*  LDH  --   --  364*  CRP 8.8* 11.3* 8.6*    Lab Results  Component Value Date   SARSCOV2NAA Detected (A) 07/22/2020   [place] NEGATIVE 11/08/2019   [place] NEGATIVE 10/20/2019  -Remdesivir -Decadron -Per  EMR consistented for Actemra if indicated.  Patient's CRP continues to climb, however patient's O2 demand and SPO2 does not indicate need at this moment if there is increased need will administer Actemra. -8/5 CRP appears to be trending down.  We will continue to hold on Actemra  Evaded D-dimers/Acute DVT -CTA chest negative for PE -Venous Doppler positive acute DVT and left peroneal vein-this is small vein below the knee, patient currently pain-free, but given poor ambulatory status start on full anticoagulation. -Continue Lovenox switched to Eliquis once stable will need to continue 3 to 6 months   Essential HTN -BP meds initially held secondary to hypotension restart Cardizem at a lower dose -8/5 increase Cardizem 60 mg TID  Hyponatremia -Resolved  Transaminitis -Secondary to Covid -Resolved  Diabetes type 2 uncontrolled without complication -8/3 hemoglobin A1c= 7.1 -Moderate SSI  AKI Recent Labs  Lab 07/29/20 1905 07/30/20 0355 07/31/20 0500 08/01/20 0544 08/02/20 0401  CREATININE 1.38* 1.31* 1.02* 0.91 0.86  -Resolved      DVT prophylaxis: Lovenox Code Status: Full Family Communication:  Status is: Inpatient    Dispo: The patient is from: Home              Anticipated d/c is to:??              Anticipated d/c date is: SNF?              Patient  currently unstable      Consultants:    Procedures/Significant Events:    I have personally reviewed and interpreted all radiology studies and my findings are as above.  VENTILATOR SETTINGS: Room air 8/5 SPO2 92%   Cultures   Antimicrobials: Anti-infectives (From admission, onward)   Start     Ordered Stop   07/30/20 1000  remdesivir 100 mg in sodium chloride 0.9 % 100 mL IVPB     Discontinue    "Followed by" Linked Group Details   07/29/20 1640 08/03/20 0959   07/29/20 1645  remdesivir 200 mg in sodium chloride 0.9% 250 mL IVPB       "Followed by" Linked Group Details   07/29/20 1640  07/29/20 1858       Devices    LINES / TUBES:      Continuous Infusions:    Objective: Vitals:   08/01/20 2057 08/02/20 0519 08/02/20 0745 08/02/20 1427  BP: (!) 161/59 (!) 159/67 (!) 143/63 (!) 150/63  Pulse: 71 63 66 77  Resp: 20 (!) 21 19 20   Temp: 97.6 F (36.4 C) 98.1 F (36.7 C) (!) 97.5 F (36.4 C) 98 F (36.7 C)  TempSrc: Oral Oral Oral Oral  SpO2: (!) 85% (!) 85% (!) 89% 91%  Weight:      Height:        Intake/Output Summary (Last 24 hours) at 08/02/2020 1711 Last data filed at 08/02/2020 0900 Gross per 24 hour  Intake 240 ml  Output 600 ml  Net -360 ml   Filed Weights   07/29/20 1207 07/29/20 2245 08/01/20 0741  Weight: 80 kg 68.2 kg 67.9 kg    Examination:  General: A/O x4, positive acute respiratory distress Eyes: negative scleral hemorrhage, negative anisocoria, negative icterus ENT: Negative Runny nose, negative gingival bleeding, Neck:  Negative scars, masses, torticollis, lymphadenopathy, JVD Lungs: diffuse decreased breath sounds without wheezes, positive crackles Cardiovascular: Regular rate and rhythm without murmur gallop or rub normal S1 and S2 Abdomen: negative abdominal pain, nondistended, positive soft, bowel sounds, no rebound, no ascites, no appreciable mass Extremities: No significant cyanosis, clubbing, or edema bilateral lower extremities Skin: Negative rashes, lesions, ulcers Psychiatric:  Negative depression, negative anxiety, negative fatigue, negative mania  Central nervous system:  Cranial nerves II through XII intact, tongue/uvula midline, all extremities muscle strength 5/5, sensation intact throughout, negative dysarthria, negative expressive aphasia, negative receptive aphasia.  .     Data Reviewed: Care during the described time interval was provided by me .  I have reviewed this patient's available data, including medical history, events of note, physical examination, and all test results as part of my  evaluation.  CBC: Recent Labs  Lab 07/29/20 1905 07/30/20 0355 07/31/20 0500 08/01/20 0544 08/02/20 0401  WBC 6.1 5.2 8.4 5.1 4.6  NEUTROABS  --  4.3 6.8 4.1 4.0  HGB 13.0 13.4 14.4 13.0 13.0  HCT 38.0 38.7 40.7 37.4 37.2  MCV 90.3 90.4 90.0 90.3 91.6  PLT 175 173 253 260 865   Basic Metabolic Panel: Recent Labs  Lab 07/29/20 1234 07/29/20 1234 07/29/20 1905 07/30/20 0355 07/31/20 0500 08/01/20 0544 08/02/20 0401  NA 126*  --   --  130* 132* 136 134*  K 4.2  --   --  4.1 3.8 4.4 4.7  CL 91*  --   --  96* 98 102 100  CO2 22  --   --  23 23 24 23   GLUCOSE 180*  --   --  199* 219* 168* 169*  BUN 34*  --   --  39* 38* 27* 23  CREATININE 1.29*   < > 1.38* 1.31* 1.02* 0.91 0.86  CALCIUM 8.1*  --   --  8.0* 8.4* 8.1* 8.3*  MG  --   --   --   --   --   --  2.6*  PHOS  --   --   --   --  4.0  --  4.1   < > = values in this interval not displayed.   GFR: Estimated Creatinine Clearance: 49.4 mL/min (by C-G formula based on SCr of 0.86 mg/dL). Liver Function Tests: Recent Labs  Lab 07/30/20 0355 07/31/20 0500 08/01/20 0544 08/02/20 0401  AST 69* 48* 31 31  ALT 52* 52* 39 36  ALKPHOS 43 47 39 42  BILITOT 1.4* 1.1 1.1 1.1  PROT 5.7* 5.8* 5.3* 5.6*  ALBUMIN 2.6* 2.6* 2.4* 2.4*   No results for input(s): LIPASE, AMYLASE in the last 168 hours. No results for input(s): AMMONIA in the last 168 hours. Coagulation Profile: No results for input(s): INR, PROTIME in the last 168 hours. Cardiac Enzymes: No results for input(s): CKTOTAL, CKMB, CKMBINDEX, TROPONINI in the last 168 hours. BNP (last 3 results) No results for input(s): PROBNP in the last 8760 hours. HbA1C: Recent Labs    07/31/20 0500  HGBA1C 7.1*   CBG: Recent Labs  Lab 08/01/20 1644 08/01/20 2135 08/02/20 0743 08/02/20 1152 08/02/20 1623  GLUCAP 103* 232* 150* 150* 129*   Lipid Profile: No results for input(s): CHOL, HDL, LDLCALC, TRIG, CHOLHDL, LDLDIRECT in the last 72 hours. Thyroid Function  Tests: No results for input(s): TSH, T4TOTAL, FREET4, T3FREE, THYROIDAB in the last 72 hours. Anemia Panel: Recent Labs    08/02/20 0401  FERRITIN 768*   Sepsis Labs: Recent Labs  Lab 07/29/20 1905  PROCALCITON 0.12    No results found for this or any previous visit (from the past 240 hour(s)).       Radiology Studies: No results found.      Scheduled Meds: . benzonatate  100 mg Oral Q8H  . dexamethasone (DECADRON) injection  6 mg Intravenous Q24H  . diltiazem  30 mg Oral Q6H  . enoxaparin (LOVENOX) injection  70 mg Subcutaneous Q12H  . feeding supplement (GLUCERNA SHAKE)  237 mL Oral BID BM  . insulin aspart  0-15 Units Subcutaneous TID WC  . insulin aspart  0-5 Units Subcutaneous QHS  . pantoprazole  40 mg Oral Daily  . sodium chloride flush  3 mL Intravenous Once   Continuous Infusions:    LOS: 4 days    Time spent:40 min    Michaeal Davis, Geraldo Docker, MD [facility] Pager [phone]  If 7PM-7AM, please contact night-coverage [email or web address] Password [email or web address] 08/02/2020, 5:11 PM

## 2020-08-02 NOTE — Progress Notes (Signed)
Physical Therapy Treatment Patient Details Name: Taylor Banks MRN: [id number] DOB: [birth year] Today's Date: 08/02/2020    History of Present Illness 74 y.o female admitted on 07/29/20 for back pain, cough, fatiuge.  Pt dx with COVID-19  7 days PTA.  Hospital dx inculde acute hypoxic respiratory failure due to COVID 19 PNA, requiring supplemental O2, elevated D-dimer with (+) L LE DVT, CT of chest negative for PE, hyponatremia, transaminitis, hyperglycemia, and AKI.  Pt with significant PMH of HTN, cervical CA s/p hysterectomy, carotid artery occlusion s/p L CEA, obesity.      PT Comments    Pt is alert and oriented, but I feel still remains confused.  She requires min assist for mobility and transfers.  She was on RA when I entered and her sats were 77%, 5 L O2 [place] re-applied to her nose and sats rose slowly to 88%.  Social worker reached out to me and said husband is too sick to provide care at discharge and they are interested in seeking SNF level rehab at discharge.  PT will continue to follow acutely for safe mobility progression   Follow Up Recommendations  SNF     Equipment Recommendations  Rolling walker with 5" wheels;Other (comment) (home O2)    Recommendations for Other Services       Precautions / Restrictions Precautions Precautions: Fall;Other (comment) Precaution Comments: monitor O2 sats    Mobility  Bed Mobility Overal bed mobility: Needs Assistance Bed Mobility: Sit to Supine       Sit to supine: Supervision   General bed mobility comments: Supervision for safety  Transfers Overall transfer level: Needs assistance Equipment used: 1 person hand held assist Transfers: Sit to/from Stand;Stand Pivot Transfers Sit to Stand: Min assist Stand pivot transfers: Min assist       General transfer comment: Min assist to stand from [facility] multiple times, min assist to pivot to the bed.  She was very fatigued by multiple transfers.    Ambulation/Gait                  Stairs             Wheelchair Mobility    Modified Rankin (Stroke Patients Only)       Balance Overall balance assessment: Needs assistance Sitting-balance support: Feet supported;No upper extremity supported Sitting balance-Leahy Scale: Good     Standing balance support: Single extremity supported Standing balance-Leahy Scale: Poor Standing balance comment: needs external support for balance in standing.                             Cognition Arousal/Alertness: Awake/alert Behavior During Therapy: Flat affect Overall Cognitive Status: Impaired/Different from baseline Area of Impairment: Memory;Following commands;Awareness;Problem solving;Safety/judgement                   Current Attention Level: Selective Memory: Decreased short-term memory Following Commands: Follows one step commands consistently;Follows one step commands with increased time Safety/Judgement: Decreased awareness of deficits Awareness: Emergent Problem Solving: Slow processing;Decreased initiation General Comments: Pt continues to be mildly confused, difficulty with complex commands/instructions, slow to process.       Exercises Other Exercises Other Exercises: attempted instruction on incentive spirometer again today and she is unable to get it.  She continues to blow into it instead of inhaling.  I tried various descrptors and she was unable to get it.  She did well with flutter valve x10 reps and I  encouraged her to do 10 reps every hour.     General Comments General comments (skin integrity, edema, etc.): Pt on RA when I entered the room, sats were 77%, Placed back on 5 L O2 [place] and pt's sats came back up to 88%.  DOE 3/4 and RR in the 30s.        Pertinent Vitals/Pain Pain Assessment: No/denies pain    Home Living                      Prior Function            PT Goals (current goals can now be found in the care plan section) Acute Rehab PT  Goals Patient Stated Goal: Pt agreeable to post acute rehab at discharge Progress towards PT goals: Not progressing toward goals - comment (increased fatigue, increased assist and less mobility)    Frequency    Min 3X/week      PT Plan Discharge plan needs to be updated    Co-evaluation              AM-PAC PT "6 Clicks" Mobility   Outcome Measure  Help needed turning from your back to your side while in a flat bed without using bedrails?: A Little Help needed moving from lying on your back to sitting on the side of a flat bed without using bedrails?: A Little Help needed moving to and from a bed to a chair (including a wheelchair)?: A Little Help needed standing up from a chair using your arms (e.g., wheelchair or bedside chair)?: A Little Help needed to walk in hospital room?: A Little Help needed climbing 3-5 steps with a railing? : A Lot 6 Click Score: 17    End of Session Equipment Utilized During Treatment: Oxygen Activity Tolerance: Patient limited by pain Patient left: in bed;with call bell/phone within reach;with bed alarm set   PT Visit Diagnosis: Muscle weakness (generalized) (M62.81);Difficulty in walking, not elsewhere classified (R26.2)     Time: 4010-2725 PT Time Calculation (min) (ACUTE ONLY): 18 min  Charges:  $Therapeutic Activity: 8-22 mins                     Verdene Lennert, PT, DPT  Acute Rehabilitation [phone] pager [phone]) [phone] office

## 2020-08-02 NOTE — Plan of Care (Signed)

## 2020-08-02 NOTE — Progress Notes (Signed)
Spoke to patient family today and gave update on patient.  Relayed message to patient from family.

## 2020-08-02 NOTE — Progress Notes (Signed)
ANTICOAGULATION CONSULT NOTE - Initial Consult  Pharmacy Consult for Lovenox Indication: DVT  Allergies  Allergen Reactions  . Losartan Nausea And Vomiting    Patient Measurements: Height: 5' (152.4 cm) Weight: 67.9 kg (149 lb 11.1 oz) IBW/kg (Calculated) : 45.5  Vital Signs: Temp: 98.1 F (36.7 C) (08/05 0519) Temp Source: Oral (08/05 0519) BP: 159/67 (08/05 0519) Pulse Rate: 63 (08/05 0519)  Labs: Recent Labs    07/31/20 0500 07/31/20 0500 08/01/20 0544 08/02/20 0401  HGB 14.4   < > 13.0 13.0  HCT 40.7  --  37.4 37.2  PLT 253  --  260 303  CREATININE 1.02*  --  0.91 0.86   < > = values in this interval not displayed.    Estimated Creatinine Clearance: 49.4 mL/min (by C-G formula based on SCr of 0.86 mg/dL).   Medical History: Past Medical History:  Diagnosis Date  . Cancer ([place])    cancerous cells in cervix -- had no treatments,just hysterectomy  . Carotid artery occlusion    Status post left CEA 2007 - Dr. Donnetta Hutching  . GERD (gastroesophageal reflux disease)    takes otc when she eats wrong foods  . Hyperlipidemia   . Hypertension   . Obesity      Assessment: 74 years of age admitted with COVID on Lovenox for DVT prophylaxis now with acute DVT to increase to treatment dose Lovenox. Last dose of Lovenox 40mg  given 8/1 at 1700 PM.   CBC is stable. No bleeding noted. SCr is trending back down with current CrCl >30 mL/min.   Goal of Therapy:  Anti-Xa level 0.6-1 units/ml 4hrs after LMWH dose given Monitor platelets by anticoagulation protocol: Yes   Plan:  Continue Lovenox to 70mg  subcutaneous injection every 12 hours.  Monitor renal function, CBC, and clinical status.  PO apixaban when stable,Continue for 3-6 months per MD note  Brannan Cassedy A. Levada Dy, PharmD, BCPS, FNKF Clinical Pharmacist [place] Please utilize Amion for appropriate phone number to reach the unit pharmacist ([place])

## 2020-08-03 LAB — CBC WITH DIFFERENTIAL/PLATELET
Abs Immature Granulocytes: 0.14 10*3/uL — ABNORMAL HIGH (ref 0.00–0.07)
Basophils Absolute: 0 10*3/uL (ref 0.0–0.1)
Basophils Relative: 1 %
Eosinophils Absolute: 0 10*3/uL (ref 0.0–0.5)
Eosinophils Relative: 0 %
HCT: 42.3 % (ref 36.0–46.0)
Hemoglobin: 14.5 g/dL (ref 12.0–15.0)
Immature Granulocytes: 2 %
Lymphocytes Relative: 12 %
Lymphs Abs: 0.8 10*3/uL (ref 0.7–4.0)
MCH: 31.5 pg (ref 26.0–34.0)
MCHC: 34.3 g/dL (ref 30.0–36.0)
MCV: 92 fL (ref 80.0–100.0)
Monocytes Absolute: 0.4 10*3/uL (ref 0.1–1.0)
Monocytes Relative: 6 %
Neutro Abs: 5.5 10*3/uL (ref 1.7–7.7)
Neutrophils Relative %: 79 %
Platelets: 411 10*3/uL — ABNORMAL HIGH (ref 150–400)
RBC: 4.6 MIL/uL (ref 3.87–5.11)
RDW: 12.5 % (ref 11.5–15.5)
WBC: 6.9 10*3/uL (ref 4.0–10.5)
nRBC: 0 % (ref 0.0–0.2)

## 2020-08-03 LAB — COMPREHENSIVE METABOLIC PANEL
ALT: 31 U/L (ref 0–44)
AST: 34 U/L (ref 15–41)
Albumin: 2.3 g/dL — ABNORMAL LOW (ref 3.5–5.0)
Alkaline Phosphatase: 46 U/L (ref 38–126)
Anion gap: 14 (ref 5–15)
BUN: 22 mg/dL (ref 8–23)
CO2: 21 mmol/L — ABNORMAL LOW (ref 22–32)
Calcium: 8.4 mg/dL — ABNORMAL LOW (ref 8.9–10.3)
Chloride: 99 mmol/L (ref 98–111)
Creatinine, Ser: 0.91 mg/dL (ref 0.44–1.00)
GFR calc Af Amer: >60 mL/min (ref >60)
GFR calc non Af Amer: >60 mL/min (ref >60)
Glucose, Bld: 199 mg/dL — ABNORMAL HIGH (ref 70–99)
Potassium: 5.8 mmol/L — ABNORMAL HIGH (ref 3.5–5.1)
Sodium: 134 mmol/L — ABNORMAL LOW (ref 135–145)
Total Bilirubin: 1 mg/dL (ref 0.3–1.2)
Total Protein: 6.1 g/dL — ABNORMAL LOW (ref 6.5–8.1)

## 2020-08-03 LAB — GLUCOSE, CAPILLARY
Glucose-Capillary: 155 mg/dL — ABNORMAL HIGH (ref 70–99)
Glucose-Capillary: 220 mg/dL — ABNORMAL HIGH (ref 70–99)
Glucose-Capillary: 154 mg/dL — ABNORMAL HIGH (ref 70–99)
Glucose-Capillary: 266 mg/dL — ABNORMAL HIGH (ref 70–99)

## 2020-08-03 LAB — PHOSPHORUS: Phosphorus: 4.9 mg/dL — ABNORMAL HIGH (ref 2.5–4.6)

## 2020-08-03 LAB — LACTATE DEHYDROGENASE: LDH: 467 U/L — ABNORMAL HIGH (ref 98–192)

## 2020-08-03 LAB — C-REACTIVE PROTEIN: CRP: 6.1 mg/dL — ABNORMAL HIGH (ref <1.0)

## 2020-08-03 LAB — MAGNESIUM: Magnesium: 2.5 mg/dL — ABNORMAL HIGH (ref 1.7–2.4)

## 2020-08-03 LAB — FERRITIN: Ferritin: 735 ng/mL — ABNORMAL HIGH (ref 11–307)

## 2020-08-03 LAB — D-DIMER, QUANTITATIVE: D-Dimer, Quant: 1.1 ug/mL-FEU — ABNORMAL HIGH (ref 0.00–0.50)

## 2020-08-03 MED ORDER — APIXABAN 5 MG PO TABS
10.0000 mg | ORAL_TABLET | Freq: Two times a day (BID) | ORAL | Status: DC
  Administered 2020-08-03 – 2020-08-04 (×2): 10 mg via ORAL
  Filled 2020-08-03 (×2): qty 2

## 2020-08-03 MED ORDER — SODIUM ZIRCONIUM CYCLOSILICATE 10 G PO PACK
10.0000 g | PACK | Freq: Two times a day (BID) | ORAL | Status: AC
  Administered 2020-08-03 (×2): 10 g via ORAL
  Filled 2020-08-03 (×2): qty 1

## 2020-08-03 MED ORDER — DILTIAZEM HCL ER COATED BEADS 120 MG PO CP24: 120.0000 mg | ORAL_CAPSULE | Freq: Every day | ORAL | Status: DC

## 2020-08-03 MED ORDER — DILTIAZEM HCL ER COATED BEADS 120 MG PO CP24
120.0000 mg | ORAL_CAPSULE | Freq: Every day | ORAL | Status: DC
  Administered 2020-08-03 – 2020-08-04 (×2): 120 mg via ORAL
  Filled 2020-08-03 (×2): qty 1

## 2020-08-03 MED ORDER — APIXABAN 5 MG PO TABS: 5.0000 mg | ORAL_TABLET | Freq: Two times a day (BID) | ORAL | Status: DC

## 2020-08-03 NOTE — Progress Notes (Signed)
Pharmacy called after I sent message questioning what diltiazem to give because there is a 120 mg capsule 24 hr SR that is on MAR plus another for future medication of diltiazem 120 mg to be given also.  He advised to give the 120 mg SR capsule.  I confirmed with him that there are two on there and he indicated that this is the correct one.

## 2020-08-03 NOTE — Progress Notes (Signed)
Patient sitting upright in her bed with tray table in front of her and eating dinner.   Patient is in no distress. Patient is disoriented at times but easily reorients to time but is aware she is in the hospital and aware of self.  Patient bed is low locked in place.  Patient call bell in place next to her and advised if she needs anything to call. Patient side rails are up. Patient does have her oxygen in place at this time.  Patient on bedside monitor as well.  Patient has no complaints or requests at this time.

## 2020-08-03 NOTE — Progress Notes (Addendum)
Occupational Therapy Treatment Patient Details Name: Taylor Banks MRN: [id number] DOB: [birth year] Today's Date: 08/03/2020    History of present illness 74 y.o female admitted on 07/29/20 for back pain, cough, fatiuge.  Pt dx with COVID-19  7 days PTA.  Hospital dx inculde acute hypoxic respiratory failure due to COVID 19 PNA, requiring supplemental O2, elevated D-dimer with (+) L LE DVT, CT of chest negative for PE, hyponatremia, transaminitis, hyperglycemia, and AKI.  Pt with significant PMH of HTN, cervical CA s/p hysterectomy, carotid artery occlusion s/p L CEA, obesity.     OT comments  Patient up in chair on arrival.  She continues to present flat and with slow responses, though able to follow simple commands.  Patient seeming very fatigued today.  Agreeable to walk to sink for grooming, though needed to use BSC mid way.  Unable to progress to sink after toileting as patient too fatigued.  Completed toileting and toilet transfer with min assist.  Patient on 3L O2 initially with SpO2 88 at rest, though after toileting SpO2 decreased to 70.  Required 6L O2 HFNC and 5 min rest to recover to 88.  After rest, returned patient to 3L [place].  Patient did initiate pursed lip breathing independently this session, indicating some carry over.  Emphasized importance of keeping O2 on as patient frequently removing.  Will continue to follow with OT acutely to address the deficits listed below.    Follow Up Recommendations  Home health OT;Supervision - Intermittent    Equipment Recommendations  3 in 1 bedside commode    Recommendations for Other Services      Precautions / Restrictions Precautions Precautions: Fall;Other (comment) Precaution Comments: monitor O2 sats Restrictions Weight Bearing Restrictions: No       Mobility Bed Mobility               General bed mobility comments: Up in chair  Transfers Overall transfer level: Needs assistance Equipment used: Rolling walker (2  wheeled) Transfers: Sit to/from Stand;Stand Pivot Transfers Sit to Stand: Min assist Stand pivot transfers: Min assist            Balance Overall balance assessment: Needs assistance Sitting-balance support: Bilateral upper extremity supported;Feet supported Sitting balance-Leahy Scale: Good Sitting balance - Comments: leaning heavily on arms in sitting but due to fatigue, is able to keep balance if needed   Standing balance support: Bilateral upper extremity supported;During functional activity Standing balance-Leahy Scale: Poor                             ADL either performed or assessed with clinical judgement   ADL Overall ADL's : Needs assistance/impaired                         Toilet Transfer: Ambulation;BSC;Minimal assistance   Toileting- Clothing Manipulation and Hygiene: Minimal assistance;Sit to/from stand Toileting - Clothing Manipulation Details (indicate cue type and reason): Able to initiate pericare though fatigued quickly requiring some assist with wiping       General ADL Comments: O2 demand and poor activity tolerance is biggest limitation     Vision       Perception     Praxis      Cognition Arousal/Alertness: Awake/alert Behavior During Therapy: Flat affect Overall Cognitive Status: Impaired/Different from baseline Area of Impairment: Memory;Following commands;Awareness;Problem solving;Safety/judgement  Current Attention Level: Selective Memory: Decreased short-term memory Following Commands: Follows one step commands consistently;Follows one step commands with increased time Safety/Judgement: Decreased awareness of deficits Awareness: Emergent Problem Solving: Slow processing;Decreased initiation General Comments: Patient continues to present very flat, delayed responses though follows simples commands.  Able to initiate pursed lip breathing independently this session        Exercises      Shoulder Instructions       General Comments      Pertinent Vitals/ Pain       Pain Assessment: No/denies pain  Home Living                                          Prior Functioning/Environment              Frequency  Min 2X/week        Progress Toward Goals  OT Goals(current goals can now be found in the care plan section)  Progress towards OT goals: Progressing toward goals  Acute Rehab OT Goals Patient Stated Goal: Pt agreeable to post acute rehab at discharge OT Goal Formulation: With patient Time For Goal Achievement: 08/15/20 Potential to Achieve Goals: Good  Plan Discharge plan remains appropriate;Frequency remains appropriate    Co-evaluation                 AM-PAC OT "6 Clicks" Daily Activity     Outcome Measure   Help from another person eating meals?: None Help from another person taking care of personal grooming?: A Little Help from another person toileting, which includes using toliet, bedpan, or urinal?: A Little Help from another person bathing (including washing, rinsing, drying)?: A Little Help from another person to put on and taking off regular upper body clothing?: A Little Help from another person to put on and taking off regular lower body clothing?: A Little 6 Click Score: 19    End of Session Equipment Utilized During Treatment: Oxygen;Rolling walker  OT Visit Diagnosis: Unsteadiness on feet (R26.81);Other symptoms and signs involving cognitive function;Other (comment)   Activity Tolerance Patient tolerated treatment well;Patient limited by lethargy   Patient Left in chair;with call bell/phone within reach;with chair alarm set   Nurse Communication Mobility status        Time: 1030-1057 OT Time Calculation (min): 27 min  Charges: OT General Charges $OT Visit: 1 Visit OT Treatments $Self Care/Home Management : 23-37 mins   August Luz, OTR/L   Phylliss Bob 08/03/2020, 11:30  AM

## 2020-08-03 NOTE — TOC Progression Note (Addendum)
Transition of Care [facility]) - Progression Note    Patient Details  Name: LAILONI BAQUERA MRN: [id number] Date of Birth: [birth year]  Transition of [facility]) CM/SW [place], LCSW Phone Number: 08/03/2020, 3:49 PM  Clinical Narrative:    Insurance approval received for patient to discharge to [facility] tomorrow if stable: [phone] for 6 days. Ambulance transport also authorized: [id number].   CSW spoke with Hilda Blades (actually patient's niece [phone]). She reported agreement with discharge to [facility] since it is nearby her house and appreciates hospital staff. Requests [facility] for transport. She will relay the info to patient's spouse since he wanted her to be the contact.     Expected Discharge Plan: [place] Barriers to Discharge: Continued Medical Work up  Expected Discharge Plan and Services Expected Discharge Plan: [place] In-house Referral: Clinical Social Work   Post Acute Care Choice: [place] Living arrangements for the past 2 months: Single Family Home                           HH Arranged: PT, OT           Social Determinants of Health (SDOH) Interventions    Readmission Risk Interventions No flowsheet data found.

## 2020-08-03 NOTE — Progress Notes (Signed)
PROGRESS NOTE    Taylor Banks  OMV:[id number] DOB: July 13, 1946 DOA: 07/29/2020 PCP: Redmond School, MD     Brief Narrative:  74 y.o.WF PMHx HTN and cervical cancer status post hysterectomy   Last week her husband was diagnosed with Covid infection when he was routinely tested at a hospital when he presented for treatment of hypoglycemia. Patient decided to get herself tested at the time and was also noted to be +7 days ago. Since that time patient states she has been progressively feeling worse she is not sure how. Patient states that her appetite is okay and that she has been urinating sufficiently. She does admit to feeling tired but denies overt shortness of breath. Patient denies that she is confused. When asked why she kept taking her oxygen off she stated "I cannot understand anybody with all the stuff on my face". Note she does not like having a mask on her face. However states that she knows she is at [facility] for treatment of Covid.  Her work-up was significant for hypoxia requiring oxygen, hyponatremia secondary to dehydration.    Subjective: 8/6 afebrile overnight A/O x4, extremely weak but much more alert today.  Positive S OB, negative abdominal pain, negative nausea.  Negative vomiting. States her husband was in the hospital for 3 days with Covid he is at home by himself extremely weak unable to care for himself.  She states he definitely is unable to care for her if she were to come home   Assessment & Plan: Covid vaccination; negative vaccination   Principal Problem:   Pneumonia due to COVID-19 virus Active Problems:   Hypertension   Acute hypoxic respiratory failure due to COVID-19 pneumonia . COVID-19 Labs  Recent Labs    08/01/20 0544 08/02/20 0401 08/03/20 0324  DDIMER 1.54* 1.52* 1.10*  FERRITIN  --  768* 735*  LDH  --  364* 467*  CRP 11.3* 8.6* 6.1*    Lab Results  Component Value Date   SARSCOV2NAA Detected (A) 07/22/2020   [place]  NEGATIVE 11/08/2019   [place] NEGATIVE 10/20/2019  -Remdesivir -Decadron -Per EMR consistented for Actemra if indicated.  Patient's CRP continues to climb, however patient's O2 demand and SPO2 does not indicate need at this moment if there is increased need will administer Actemra. -8/5 CRP appears to be trending down.  We will continue to hold on Actemra  Evaded D-dimers/Acute DVT -CTA chest negative for PE -Venous Doppler positive acute DVT and left peroneal vein-this is small vein below the knee, patient currently pain-free, but given poor ambulatory status start on full anticoagulation. -Continue Lovenox switched to Eliquis once stable will need to continue 3 to 6 months  -8/6 start Eliquis  Essential HTN -BP meds initially held secondary to hypotension restart Cardizem at a lower dose -8/6 Cardizem CD 120 mg daily  Hyponatremia -Mild asymptomatic continue to monitor  Transaminitis -Secondary to Covid -Resolved  Diabetes type 2 uncontrolled without complication -8/3 hemoglobin A1c= 7.1 -Moderate SSI  AKI Recent Labs  Lab 07/30/20 0355 07/31/20 0500 08/01/20 0544 08/02/20 0401 08/03/20 0324  CREATININE 1.31* 1.02* 0.91 0.86 0.91  -Resolved  Hyperkalemia -Lokelma 10 g x 2    DVT prophylaxis: Lovenox Code Status: Full Family Communication:  Status is: Inpatient    Dispo: The patient is from: Home              Anticipated d/c is to: SNF              Anticipated d/c  date is: 8/7?              Patient currently unstable      Consultants:    Procedures/Significant Events:    I have personally reviewed and interpreted all radiology studies and my findings are as above.  VENTILATOR SETTINGS: Room air 8/6 SPO2 93%   Cultures   Antimicrobials: Anti-infectives (From admission, onward)   Start     Ordered Stop   07/30/20 1000  remdesivir 100 mg in sodium chloride 0.9 % 100 mL IVPB     Discontinue    "Followed by" Linked Group Details    07/29/20 1640 08/03/20 0959   07/29/20 1645  remdesivir 200 mg in sodium chloride 0.9% 250 mL IVPB       "Followed by" Linked Group Details   07/29/20 1640 07/29/20 1858       Devices    LINES / TUBES:      Continuous Infusions:    Objective: Vitals:   08/02/20 1427 08/02/20 2100 08/03/20 0500 08/03/20 0813  BP: (!) 150/63 (!) 154/76 136/70 (!) 145/70  Pulse: 77 66 (!) 52 64  Resp: 20 (!) 22 17 (!) 23  Temp: 98 F (36.7 C) 97.9 F (36.6 C) 97.6 F (36.4 C) 97.7 F (36.5 C)  TempSrc: Oral Axillary Axillary Oral  SpO2: 91% 96% 90% (!) 89%  Weight:      Height:        Intake/Output Summary (Last 24 hours) at 08/03/2020 1033 Last data filed at 08/03/2020 0900 Gross per 24 hour  Intake 240 ml  Output 225 ml  Net 15 ml   Filed Weights   07/29/20 1207 07/29/20 2245 08/01/20 0741  Weight: 80 kg 68.2 kg 67.9 kg    Examination:  General: A/O x4, positive acute respiratory distress Eyes: negative scleral hemorrhage, negative anisocoria, negative icterus ENT: Negative Runny nose, negative gingival bleeding, Neck:  Negative scars, masses, torticollis, lymphadenopathy, JVD Lungs: diffuse decreased breath sounds without wheezes, positive crackles Cardiovascular: Regular rate and rhythm without murmur gallop or rub normal S1 and S2 Abdomen: negative abdominal pain, nondistended, positive soft, bowel sounds, no rebound, no ascites, no appreciable mass Extremities: No significant cyanosis, clubbing, or edema bilateral lower extremities Skin: Negative rashes, lesions, ulcers Psychiatric:  Negative depression, negative anxiety, negative fatigue, negative mania  Central nervous system:  Cranial nerves II through XII intact, tongue/uvula midline, all extremities muscle strength 5/5, sensation intact throughout, negative dysarthria, negative expressive aphasia, negative receptive aphasia.  .     Data Reviewed: Care during the described time interval was provided by me .  I  have reviewed this patient's available data, including medical history, events of note, physical examination, and all test results as part of my evaluation.  CBC: Recent Labs  Lab 07/30/20 0355 07/31/20 0500 08/01/20 0544 08/02/20 0401 08/03/20 0324  WBC 5.2 8.4 5.1 4.6 6.9  NEUTROABS 4.3 6.8 4.1 4.0 5.5  HGB 13.4 14.4 13.0 13.0 14.5  HCT 38.7 40.7 37.4 37.2 42.3  MCV 90.4 90.0 90.3 91.6 92.0  PLT 173 253 260 303 785*   Basic Metabolic Panel: Recent Labs  Lab 07/30/20 0355 07/31/20 0500 08/01/20 0544 08/02/20 0401 08/03/20 0324  NA 130* 132* 136 134* 134*  K 4.1 3.8 4.4 4.7 5.8*  CL 96* 98 102 100 99  CO2 23 23 24 23  21*  GLUCOSE 199* 219* 168* 169* 199*  BUN 39* 38* 27* 23 22  CREATININE 1.31* 1.02* 0.91 0.86 0.91  CALCIUM 8.0*  8.4* 8.1* 8.3* 8.4*  MG  --   --   --  2.6* 2.5*  PHOS  --  4.0  --  4.1 4.9*   GFR: Estimated Creatinine Clearance: 46.7 mL/min (by C-G formula based on SCr of 0.91 mg/dL). Liver Function Tests: Recent Labs  Lab 07/30/20 0355 07/31/20 0500 08/01/20 0544 08/02/20 0401 08/03/20 0324  AST 69* 48* 31 31 34  ALT 52* 52* 39 36 31  ALKPHOS 43 47 39 42 46  BILITOT 1.4* 1.1 1.1 1.1 1.0  PROT 5.7* 5.8* 5.3* 5.6* 6.1*  ALBUMIN 2.6* 2.6* 2.4* 2.4* 2.3*   No results for input(s): LIPASE, AMYLASE in the last 168 hours. No results for input(s): AMMONIA in the last 168 hours. Coagulation Profile: No results for input(s): INR, PROTIME in the last 168 hours. Cardiac Enzymes: No results for input(s): CKTOTAL, CKMB, CKMBINDEX, TROPONINI in the last 168 hours. BNP (last 3 results) No results for input(s): PROBNP in the last 8760 hours. HbA1C: No results for input(s): HGBA1C in the last 72 hours. CBG: Recent Labs  Lab 08/02/20 0743 08/02/20 1152 08/02/20 1623 08/02/20 2049 08/03/20 0825  GLUCAP 150* 150* 129* 215* 155*   Lipid Profile: No results for input(s): CHOL, HDL, LDLCALC, TRIG, CHOLHDL, LDLDIRECT in the last 72 hours. Thyroid  Function Tests: No results for input(s): TSH, T4TOTAL, FREET4, T3FREE, THYROIDAB in the last 72 hours. Anemia Panel: Recent Labs    08/02/20 0401 08/03/20 0324  FERRITIN 768* 735*   Sepsis Labs: Recent Labs  Lab 07/29/20 1905  PROCALCITON 0.12    No results found for this or any previous visit (from the past 240 hour(s)).       Radiology Studies: No results found.      Scheduled Meds: . benzonatate  100 mg Oral Q8H  . dexamethasone (DECADRON) injection  6 mg Intravenous Q24H  . enoxaparin (LOVENOX) injection  70 mg Subcutaneous Q12H  . feeding supplement (GLUCERNA SHAKE)  237 mL Oral BID BM  . insulin aspart  0-15 Units Subcutaneous TID WC  . insulin aspart  0-5 Units Subcutaneous QHS  . pantoprazole  40 mg Oral Daily  . sodium chloride flush  3 mL Intravenous Once   Continuous Infusions:    LOS: 5 days    Time spent:40 min    Taylor Banks, Geraldo Docker, MD [facility] Pager [phone]  If 7PM-7AM, please contact night-coverage [email or web address] Password [facility] 08/03/2020, 10:33 AM

## 2020-08-03 NOTE — Plan of Care (Signed)

## 2020-08-03 NOTE — TOC Initial Note (Signed)
Transition of Care [facility]) - Initial/Assessment Note    Patient Details  Name: Taylor Banks MRN: [id number] Date of Birth: 26-Apr-1946  [facility]) CM/SW Contact:    Benard Halsted, LCSW Phone Number: 08/03/2020, 2:33 PM  Clinical Narrative:                 CSW received consult for possible SNF placement at time of discharge. CSW spoke with patient regarding PT recommendation of SNF placement at time of discharge. Patient reported that patient's spouse is currently unable to care for patient at their home given patient's current physical needs and fall risk. Patient expressed understanding of PT recommendation and is agreeable to SNF placement at time of discharge. CSW discussed insurance authorization process and provided Medicare SNF ratings list. CSW explained that there is only one available SNF at this time, ([facility] unable to accept patients at this time). Patient expressed being hopeful for rehab and to feel better soon. CSW initiated insurance authorization process with [facility]. CSW made patient's spouse aware. He requested CSW speak with patient's Serafina Royals ([phone]) to make her aware of the plan. CSW left voicemail. No further questions reported at this time. CSW to continue to follow and assist with discharge planning needs.   Expected Discharge Plan: Skilled Nursing Facility Barriers to Discharge: Continued Medical Work up   Patient Goals and CMS Choice Patient states their goals for this hospitalization and ongoing recovery are:: Rehab CMS Medicare.gov Compare Post Acute Care list provided to:: Patient Choice offered to / list presented to : Patient, Spouse  Expected Discharge Plan and Services Expected Discharge Plan: [place] In-house Referral: Clinical Social Work   Post Acute Care Choice: [place] Living arrangements for the past 2 months: Single Family Home                           HH Arranged: PT,  OT          Prior Living Arrangements/Services Living arrangements for the past 2 months: Single Family Home Lives with:: Spouse Patient language and need for interpreter reviewed:: Yes Do you feel safe going back to the place where you live?: Yes      Need for Family Participation in Patient Care: Yes (Comment) Care giver support system in place?: Yes (comment)   Criminal Activity/Legal Involvement Pertinent to Current Situation/Hospitalization: No - Comment as needed  Activities of Daily Living Home Assistive Devices/Equipment: None ADL Screening (condition at time of admission) Patient's cognitive ability adequate to safely complete daily activities?: Yes Is the patient deaf or have difficulty hearing?: No Does the patient have difficulty seeing, even when wearing glasses/contacts?: No Does the patient have difficulty concentrating, remembering, or making decisions?: No Patient able to express need for assistance with ADLs?: Yes Does the patient have difficulty dressing or bathing?: No Independently performs ADLs?: Yes (appropriate for developmental age) Does the patient have difficulty walking or climbing stairs?: No Weakness of Legs: None Weakness of Arms/Hands: None  Permission Sought/Granted Permission sought to share information with : Facility Sport and exercise psychologist, Family Supports Permission granted to share information with : Yes, Verbal Permission Granted  Share Information with NAME: Shanon Brow  Permission granted to share info w AGENCY: SNFs  Permission granted to share info w Relationship: Spouse  Permission granted to share info w Contact Information: [phone]  Emotional Assessment   Attitude/Demeanor/Rapport:  (Slow responses) Affect (typically observed): Appropriate Orientation: : Oriented to Self,  Oriented to Place, Oriented to  Time, Oriented to Situation Alcohol / Substance Use: Not Applicable Psych Involvement: No (comment)  Admission diagnosis:   Hypoxia [R09.02] Fatigue, unspecified type [R53.83] Pneumonia due to COVID-19 virus [U07.1, J12.82] COVID-19 [U07.1] Patient Active Problem List   Diagnosis Date Noted  . Pneumonia due to COVID-19 virus 07/29/2020  . Hypertension   . Asymptomatic carotid artery stenosis, right 09/09/2017  . Aftercare following surgery of the circulatory system, [place] 02/01/2014  . Occlusion and stenosis of carotid artery without mention of cerebral infarction 02/01/2013   PCP:  Redmond School, MD Pharmacy:   [facility], [place] [address] [place] [address] Phone: [phone] Fax: [phone]     Social Determinants of Health (SDOH) Interventions    Readmission Risk Interventions No flowsheet data found.

## 2020-08-03 NOTE — NC FL2 (Signed)
[place] LEVEL OF CARE SCREENING TOOL     IDENTIFICATION  Patient Name: Taylor Banks Birthdate: Jul 09, 1946 Sex: female Admission Date (Current Location): 07/29/2020  [facility] and [place] Number:  [employer] and Address:  [facility] [place], [place] [address], [place], [place] [address]      Provider Number: [id number]  Attending Physician Name and Address:  Allie Bossier, MD  Relative Name and Phone Number:  Shanon Brow, spouse, [phone]    Current Level of Care: Hospital Recommended Level of Care: [place] Prior Approval Number:    Date Approved/Denied:   PASRR Number: [id number]  Discharge Plan: SNF    Current Diagnoses: Patient Active Problem List   Diagnosis Date Noted  . Pneumonia due to COVID-19 virus 07/29/2020  . Hypertension   . Asymptomatic carotid artery stenosis, right 09/09/2017  . Aftercare following surgery of the circulatory system, [place] 02/01/2014  . Occlusion and stenosis of carotid artery without mention of cerebral infarction 02/01/2013    Orientation RESPIRATION BLADDER Height & Weight     Self, Time, Situation, Place  O2 (Nasal cannula 3-4L) Continent, External catheter Weight: 149 lb 11.1 oz (67.9 kg) Height:  5' (152.4 cm)  BEHAVIORAL SYMPTOMS/MOOD NEUROLOGICAL BOWEL NUTRITION STATUS      Continent Diet (Please see DC Summary)  AMBULATORY STATUS COMMUNICATION OF NEEDS Skin   Limited Assist Verbally Normal                       Personal Care Assistance Level of Assistance  Bathing, Feeding, Dressing Bathing Assistance: Maximum assistance Feeding assistance: Independent Dressing Assistance: Limited assistance     Functional Limitations Info  Sight, Hearing, Speech Sight Info: Adequate Hearing Info: Adequate Speech Info: Adequate    SPECIAL CARE FACTORS FREQUENCY  PT (By licensed PT), OT (By licensed OT)     PT Frequency: 5x/week OT Frequency: 5x/week             Contractures Contractures Info: Not present    Additional Factors Info  Code Status, Allergies, Insulin Sliding Scale, Isolation Precautions Code Status Info: Full Allergies Info: Losartan   Insulin Sliding Scale Info: See dc summary for dose Isolation Precautions Info: COVID + 07/22/20     Current Medications (08/03/2020):  This is the current hospital active medication list Current Facility-Administered Medications  Medication Dose Route Frequency Provider Last Rate Last Admin  . acetaminophen (TYLENOL) tablet 650 mg  650 mg Oral Q6H PRN Vashti Hey, MD   650 mg at 07/31/20 0836  . benzonatate (TESSALON) capsule 100 mg  100 mg Oral Q8H Bonnell Public Tublu, MD   100 mg at 08/03/20 0930  . dexamethasone (DECADRON) injection 6 mg  6 mg Intravenous Q24H Bonnell Public Tublu, MD   6 mg at 08/02/20 1744  . enoxaparin (LOVENOX) injection 70 mg  70 mg Subcutaneous Q12H Sloan Leiter B, RPH   70 mg at 08/03/20 0929  . feeding supplement (GLUCERNA SHAKE) (GLUCERNA SHAKE) liquid 237 mL  237 mL Oral BID BM Allie Bossier, MD   237 mL at 08/03/20 0929  . insulin aspart (novoLOG) injection 0-15 Units  0-15 Units Subcutaneous TID WC Elgergawy, Silver Huguenin, MD   2 Units at 08/03/20 0934  . insulin aspart (novoLOG) injection 0-5 Units  0-5 Units Subcutaneous QHS Elgergawy, Silver Huguenin, MD   2 Units at 08/02/20 2103  . pantoprazole (PROTONIX) EC tablet 40 mg  40 mg Oral Daily  Elgergawy, Silver Huguenin, MD   40 mg at 08/03/20 0929  . polyethylene glycol (MIRALAX / GLYCOLAX) packet 17 g  17 g Oral Daily PRN Vashti Hey, MD   17 g at 08/01/20 0901  . sodium chloride flush (NS) 0.9 % injection 3 mL  3 mL Intravenous Once Bonnell Public Tublu, MD      . sodium zirconium cyclosilicate (LOKELMA) packet 10 g  10 g Oral BID Allie Bossier, MD      . traMADol Veatrice Bourbon) tablet 50 mg  50 mg Oral Q6H PRN Vashti Hey, MD   50 mg at 08/02/20 2103     Discharge  Medications: Please see discharge summary for a list of discharge medications.  Relevant Imaging Results:  Relevant Lab Results:   Additional Information SSN: [id number]  [place], [place]

## 2020-08-03 NOTE — Progress Notes (Signed)
ANTICOAGULATION CONSULT NOTE -  Pharmacy Consult for Apixaban Indication: DVT  Allergies  Allergen Reactions   Losartan Nausea And Vomiting    Patient Measurements: Height: 5' (152.4 cm) Weight: 67.9 kg (149 lb 11.1 oz) IBW/kg (Calculated) : 45.5  Vital Signs: Temp: 98.9 F (37.2 C) (08/06 1447) Temp Source: Oral (08/06 1447) BP: 160/85 (08/06 1447) Pulse Rate: 81 (08/06 1447)  Labs: Recent Labs    08/01/20 0544 08/01/20 0544 08/02/20 0401 08/03/20 0324  HGB 13.0   < > 13.0 14.5  HCT 37.4  --  37.2 42.3  PLT 260  --  303 411*  CREATININE 0.91  --  0.86 0.91   < > = values in this interval not displayed.    Estimated Creatinine Clearance: 46.7 mL/min (by C-G formula based on SCr of 0.91 mg/dL).   Medical History: Past Medical History:  Diagnosis Date   Cancer ([place])    cancerous cells in cervix -- had no treatments,just hysterectomy   Carotid artery occlusion    Status post left CEA 2007 - Dr. Donnetta Hutching   GERD (gastroesophageal reflux disease)    takes otc when she eats wrong foods   Hyperlipidemia    Hypertension    Obesity      Assessment: 74 years of age admitted with COVID on Lovenox for DVT prophylaxis now with acute DVT to increase to treatment dose Lovenox. Last dose of Lovenox 40mg  given 8/1 at 1700 PM.   CBC is stable. No bleeding noted. SCr is trending back down with current CrCl >30 mL/min.   Pharmacy consulted for apixaban dosing   Plan:  Apixaban 10 mg po bid x 4 days followed by 5 mg po bid  Monitor renal function, CBC, and clinical status.  Continue for 3-6 months per MD note  Alanda Slim, PharmD, [facility] Clinical Pharmacist Please see AMION for all Pharmacists' Contact Phone Numbers 08/03/2020, 5:00 PM

## 2020-08-04 DIAGNOSIS — R0902 Hypoxemia: Secondary | ICD-10-CM | POA: Diagnosis not present

## 2020-08-04 DIAGNOSIS — R5383 Other fatigue: Secondary | ICD-10-CM | POA: Diagnosis not present

## 2020-08-04 DIAGNOSIS — U071 COVID-19: Secondary | ICD-10-CM | POA: Diagnosis not present

## 2020-08-04 DIAGNOSIS — I4510 Unspecified right bundle-branch block: Secondary | ICD-10-CM | POA: Diagnosis not present

## 2020-08-04 DIAGNOSIS — R5381 Other malaise: Secondary | ICD-10-CM | POA: Diagnosis not present

## 2020-08-04 DIAGNOSIS — J1282 Pneumonia due to coronavirus disease 2019: Secondary | ICD-10-CM | POA: Diagnosis not present

## 2020-08-04 DIAGNOSIS — R279 Unspecified lack of coordination: Secondary | ICD-10-CM | POA: Diagnosis not present

## 2020-08-04 DIAGNOSIS — I10 Essential (primary) hypertension: Secondary | ICD-10-CM | POA: Diagnosis not present

## 2020-08-04 DIAGNOSIS — R404 Transient alteration of awareness: Secondary | ICD-10-CM | POA: Diagnosis not present

## 2020-08-04 DIAGNOSIS — E1165 Type 2 diabetes mellitus with hyperglycemia: Secondary | ICD-10-CM | POA: Diagnosis not present

## 2020-08-04 DIAGNOSIS — R4020 Unspecified coma: Secondary | ICD-10-CM | POA: Diagnosis not present

## 2020-08-04 DIAGNOSIS — I469 Cardiac arrest, cause unspecified: Secondary | ICD-10-CM | POA: Diagnosis not present

## 2020-08-04 DIAGNOSIS — Z7901 Long term (current) use of anticoagulants: Secondary | ICD-10-CM | POA: Diagnosis not present

## 2020-08-04 DIAGNOSIS — F17210 Nicotine dependence, cigarettes, uncomplicated: Secondary | ICD-10-CM | POA: Diagnosis not present

## 2020-08-04 DIAGNOSIS — Z743 Need for continuous supervision: Secondary | ICD-10-CM | POA: Diagnosis not present

## 2020-08-04 DIAGNOSIS — Z8541 Personal history of malignant neoplasm of cervix uteri: Secondary | ICD-10-CM | POA: Diagnosis not present

## 2020-08-04 DIAGNOSIS — R410 Disorientation, unspecified: Secondary | ICD-10-CM | POA: Diagnosis not present

## 2020-08-04 DIAGNOSIS — Z79899 Other long term (current) drug therapy: Secondary | ICD-10-CM | POA: Diagnosis not present

## 2020-08-04 DIAGNOSIS — Z794 Long term (current) use of insulin: Secondary | ICD-10-CM | POA: Diagnosis not present

## 2020-08-04 DIAGNOSIS — J9601 Acute respiratory failure with hypoxia: Secondary | ICD-10-CM | POA: Diagnosis not present

## 2020-08-04 DIAGNOSIS — I499 Cardiac arrhythmia, unspecified: Secondary | ICD-10-CM | POA: Diagnosis not present

## 2020-08-04 LAB — COMPREHENSIVE METABOLIC PANEL
ALT: 27 U/L (ref 0–44)
AST: 20 U/L (ref 15–41)
Albumin: 2.5 g/dL — ABNORMAL LOW (ref 3.5–5.0)
Alkaline Phosphatase: 38 U/L (ref 38–126)
Anion gap: 10 (ref 5–15)
BUN: 22 mg/dL (ref 8–23)
CO2: 23 mmol/L (ref 22–32)
Calcium: 8.4 mg/dL — ABNORMAL LOW (ref 8.9–10.3)
Chloride: 100 mmol/L (ref 98–111)
Creatinine, Ser: 0.81 mg/dL (ref 0.44–1.00)
GFR calc Af Amer: >60 mL/min (ref >60)
GFR calc non Af Amer: >60 mL/min (ref >60)
Glucose, Bld: 206 mg/dL — ABNORMAL HIGH (ref 70–99)
Potassium: 4.8 mmol/L (ref 3.5–5.1)
Sodium: 133 mmol/L — ABNORMAL LOW (ref 135–145)
Total Bilirubin: 0.9 mg/dL (ref 0.3–1.2)
Total Protein: 5.8 g/dL — ABNORMAL LOW (ref 6.5–8.1)

## 2020-08-04 LAB — C-REACTIVE PROTEIN: CRP: 2.6 mg/dL — ABNORMAL HIGH (ref <1.0)

## 2020-08-04 LAB — GLUCOSE, CAPILLARY
Glucose-Capillary: 252 mg/dL — ABNORMAL HIGH (ref 70–99)
Glucose-Capillary: 186 mg/dL — ABNORMAL HIGH (ref 70–99)
Glucose-Capillary: 134 mg/dL — ABNORMAL HIGH (ref 70–99)

## 2020-08-04 LAB — CBC WITH DIFFERENTIAL/PLATELET
Abs Immature Granulocytes: 0.33 10*3/uL — ABNORMAL HIGH (ref 0.00–0.07)
Basophils Absolute: 0 10*3/uL (ref 0.0–0.1)
Basophils Relative: 0 %
Eosinophils Absolute: 0 10*3/uL (ref 0.0–0.5)
Eosinophils Relative: 0 %
HCT: 37.9 % (ref 36.0–46.0)
Hemoglobin: 13.2 g/dL (ref 12.0–15.0)
Immature Granulocytes: 5 %
Lymphocytes Relative: 12 %
Lymphs Abs: 0.9 10*3/uL (ref 0.7–4.0)
MCH: 31.9 pg (ref 26.0–34.0)
MCHC: 34.8 g/dL (ref 30.0–36.0)
MCV: 91.5 fL (ref 80.0–100.0)
Monocytes Absolute: 0.7 10*3/uL (ref 0.1–1.0)
Monocytes Relative: 10 %
Neutro Abs: 5.3 10*3/uL (ref 1.7–7.7)
Neutrophils Relative %: 73 %
Platelets: 396 10*3/uL (ref 150–400)
RBC: 4.14 MIL/uL (ref 3.87–5.11)
RDW: 12.1 % (ref 11.5–15.5)
WBC: 7.2 10*3/uL (ref 4.0–10.5)
nRBC: 0 % (ref 0.0–0.2)

## 2020-08-04 LAB — FERRITIN: Ferritin: 579 ng/mL — ABNORMAL HIGH (ref 11–307)

## 2020-08-04 LAB — D-DIMER, QUANTITATIVE: D-Dimer, Quant: 0.64 ug/mL-FEU — ABNORMAL HIGH (ref 0.00–0.50)

## 2020-08-04 LAB — MAGNESIUM: Magnesium: 2.3 mg/dL (ref 1.7–2.4)

## 2020-08-04 LAB — LACTATE DEHYDROGENASE: LDH: 272 U/L — ABNORMAL HIGH (ref 98–192)

## 2020-08-04 LAB — PHOSPHORUS: Phosphorus: 4.7 mg/dL — ABNORMAL HIGH (ref 2.5–4.6)

## 2020-08-04 MED ORDER — INSULIN GLARGINE 100 UNITS/ML SOLOSTAR PEN: 8.0000 [IU] | PEN_INJECTOR | Freq: Every day | SUBCUTANEOUS | 0 refills | Status: AC

## 2020-08-04 MED ORDER — ACETAMINOPHEN 325 MG PO TABS: 650.0000 mg | ORAL_TABLET | Freq: Four times a day (QID) | ORAL | 0 refills | Status: AC | PRN

## 2020-08-04 MED ORDER — PANTOPRAZOLE SODIUM 40 MG PO TBEC: 40.0000 mg | DELAYED_RELEASE_TABLET | Freq: Every day | ORAL | 0 refills | Status: AC

## 2020-08-04 MED ORDER — INSULIN PEN NEEDLE 31G X 8 MM MISC: 0 refills | Status: AC

## 2020-08-04 MED ORDER — INSULIN GLARGINE 100 UNIT/ML ~~LOC~~ SOLN
8.0000 [IU] | Freq: Every day | SUBCUTANEOUS | Status: DC
  Administered 2020-08-04: 8 [IU] via SUBCUTANEOUS
  Filled 2020-08-04 (×2): qty 0.08

## 2020-08-04 MED ORDER — APIXABAN 5 MG PO TABS: 5.0000 mg | ORAL_TABLET | Freq: Two times a day (BID) | ORAL | 0 refills | Status: AC

## 2020-08-04 MED ORDER — NOVOLOG FLEXPEN 100 UNIT/ML ~~LOC~~ SOPN: 5.0000 [IU] | PEN_INJECTOR | Freq: Three times a day (TID) | SUBCUTANEOUS | 0 refills | Status: AC

## 2020-08-04 MED ORDER — APIXABAN 5 MG PO TABS: 10.0000 mg | ORAL_TABLET | Freq: Two times a day (BID) | ORAL | 0 refills | Status: AC

## 2020-08-04 MED ORDER — INSULIN ASPART 100 UNIT/ML ~~LOC~~ SOLN
5.0000 [IU] | Freq: Three times a day (TID) | SUBCUTANEOUS | Status: DC
  Administered 2020-08-04 (×3): 5 [IU] via SUBCUTANEOUS

## 2020-08-04 MED ORDER — DEXAMETHASONE SODIUM PHOSPHATE 10 MG/ML IJ SOLN: 6.0000 mg | INTRAMUSCULAR | 0 refills | Status: AC

## 2020-08-04 NOTE — Progress Notes (Signed)
Returned patients niece phone call (Debroah Crumpton)  I did advise her that patient has DC orders for transport to [place]. I advised that if for some reason the patient is unable to be transported to [place] due to time constraints I will call her back but everything is still in place for her to be transported there.  I will call her back and give her a phone number to [facility] to call for information when patient arrives there.

## 2020-08-04 NOTE — Plan of Care (Signed)
  Problem: Education: Goal: Knowledge of General Education information will improve Description: Including pain rating scale, medication(s)/side effects and non-pharmacologic comfort measures Outcome: Not Progressing   Problem: Health Behavior/Discharge Planning: Goal: Ability to manage health-related needs will improve Outcome: Not Progressing   Problem: Clinical Measurements: Goal: Ability to maintain clinical measurements within normal limits will improve Outcome: Not Progressing Goal: Will remain free from infection Outcome: Not Progressing Goal: Diagnostic test results will improve Outcome: Not Progressing Goal: Respiratory complications will improve Outcome: Not Progressing Goal: Cardiovascular complication will be avoided Outcome: Not Progressing   Problem: Activity: Goal: Risk for activity intolerance will decrease Outcome: Not Progressing   Problem: Nutrition: Goal: Adequate nutrition will be maintained Outcome: Not Progressing   Problem: Coping: Goal: Level of anxiety will decrease Outcome: Not Progressing   Problem: Elimination: Goal: Will not experience complications related to bowel motility Outcome: Not Progressing Goal: Will not experience complications related to urinary retention Outcome: Not Progressing   Problem: Safety: Goal: Ability to remain free from injury will improve Outcome: Not Progressing   Problem: Pain Managment: Goal: General experience of comfort will improve Outcome: Not Progressing   Problem: Skin Integrity: Goal: Risk for impaired skin integrity will decrease Outcome: Not Progressing

## 2020-08-04 NOTE — Progress Notes (Signed)
Phoned over to [facility] again for a 4th time today and spoke with Lanny Hurst again and he advised there is no one at the facility that can take report and he is not sure if they will accept the patient. I did advise that the CM/SW Miranda confirmed they will take this patient at the facility and when a person comes in to call over here for report.

## 2020-08-04 NOTE — TOC Transition Note (Signed)
Transition of Care [facility]) - CM/SW Discharge Note   Patient Details  Name: Taylor Banks MRN: [id number] Date of Birth: Jun 11, 1946  [facility]) CM/SW Contact:  Emeterio Reeve, [place] Phone Number: 08/04/2020, 3:37 PM   Clinical Narrative:    Pt will discharge to [facility] via ptar. Pts husband was notified of transfer.   Nurse to call report to [phone].   Final next level of care: Skilled Nursing Facility Barriers to Discharge: Barriers Resolved   Patient Goals and CMS Choice Patient states their goals for this hospitalization and ongoing recovery are:: REhab CMS Medicare.gov Compare Post Acute Care list provided to:: Patient Choice offered to / list presented to : Patient  Discharge Placement              [facility] Patient to be transferred to facility by: [facility]tar Name of family member notified: Shanon Brow, husband Patient and family notified of of transfer: 08/04/20  Discharge Plan and Services In-house Referral: Clinical Social Work   Post Acute Care Choice: [place]                    HH Arranged: PT, OT          Social Determinants of Health ([place]) Interventions     Readmission Risk Interventions No flowsheet data found.  Emeterio Reeve, Latanya Presser, [place] Social Worker [phone]

## 2020-08-04 NOTE — Progress Notes (Signed)
Attempted to call report at 3:40 pm and person at desk stated they do not take new admissions on weekend and the supervisor Buffy was out of the building to call back in an hour.  Spoke with SW and was confirmed that they do in fact accept this patient today.  Attempted to call back to give report and now there is no answer at phone.   Multiple attempts over the past 15 minutes and no one will answer the phone

## 2020-08-04 NOTE — Discharge Instructions (Signed)
Information on my medicine - ELIQUIS (apixaban)  This medication education was reviewed with me or my healthcare representative as part of my discharge preparation.   Why was Eliquis prescribed for you? Eliquis was prescribed to treat blood clots that may have been found in the veins of your legs (deep vein thrombosis) or in your lungs (pulmonary embolism) and to reduce the risk of them occurring again.  What do You need to know about Eliquis ? The starting dose is 10 mg (two 5 mg tablets) taken TWICE daily for the FIRST SEVEN (7) DAYS, then on (enter date)  08/07/20 PM  the dose is reduced to ONE 5 mg tablet taken TWICE daily.  Eliquis may be taken with or without food.   Try to take the dose about the same time in the morning and in the evening. If you have difficulty swallowing the tablet whole please discuss with your pharmacist how to take the medication safely.  Take Eliquis exactly as prescribed and DO NOT stop taking Eliquis without talking to the doctor who prescribed the medication.  Stopping may increase your risk of developing a new blood clot.  Refill your prescription before you run out.  After discharge, you should have regular check-up appointments with your healthcare provider that is prescribing your Eliquis.    What do you do if you miss a dose? If a dose of ELIQUIS is not taken at the scheduled time, take it as soon as possible on the same day and twice-daily administration should be resumed. The dose should not be doubled to make up for a missed dose.  Important Safety Information A possible side effect of Eliquis is bleeding. You should call your healthcare provider right away if you experience any of the following: ? Bleeding from an injury or your nose that does not stop. ? Unusual colored urine (red or dark brown) or unusual colored stools (red or black). ? Unusual bruising for unknown reasons. ? A serious fall or if you hit your head (even if there is no  bleeding).  Some medicines may interact with Eliquis and might increase your risk of bleeding or clotting while on Eliquis. To help avoid this, consult your healthcare provider or pharmacist prior to using any new prescription or non-prescription medications, including herbals, vitamins, non-steroidal anti-inflammatory drugs (NSAIDs) and supplements.  This website has more information on Eliquis (apixaban): http://[email or web address].com/eliquis/home

## 2020-08-04 NOTE — Discharge Summary (Addendum)
Physician Discharge Summary  CELE MOTE IOE:[id number] DOB: 16-Sep-1946 DOA: 07/29/2020  PCP: Redmond School, MD  Admit date: 07/29/2020 Discharge date: 08/04/2020  Time spent: 30 minutes  Recommendations for Outpatient Follow-up:  Covid vaccination; negative vaccination   Acute hypoxic respiratory failure due to COVID-19 pneumonia . COVID-19 Labs  Recent Labs    08/02/20 0401 08/03/20 0324 08/04/20 0434  DDIMER 1.52* 1.10* 0.64*  FERRITIN 768* 735* 579*  LDH 364* 467* 272*  CRP 8.6* 6.1* 2.6*    Lab Results  Component Value Date   SARSCOV2NAA Detected (A) 07/22/2020   [place] NEGATIVE 11/08/2019   [place] NEGATIVE 10/20/2019   -Remdesivir; completed course -Decadron; will complete 10-day course -Per EMR consistented for Actemra if indicated.  Patient's CRP continues to climb, however patient's O2 demand and SPO2 does not indicate need at this moment if there is increased need will administer Actemra. -8/5 CRP appears to be trending down.  We will continue to hold on Actemra SATURATION QUALIFICATIONS: (This note is used to comply with regulatory documentation for home oxygen) Patient Saturations on Room Air at Rest = 89% Patient Saturations on Room Air while Ambulating =82 % Patient Saturations on 6 Liters of oxygen while Ambulating = 82     % Please briefly explain why patient needs home oxygen: Patient dropped below 88% while on room air.  -Patient meets criteria for home O2 -Start patient on 6 L O2, titrate to maintain SPO2> 88%. -Provide Inogen home O2 concentrator  Evaded D-dimers/Acute DVT -CTA chest negative for PE -Venous Doppler positive acute DVT and left peroneal vein-this is small vein below the knee, patient currently pain-free, but given poor ambulatory status start on full anticoagulation. -Continue Lovenox switched to Eliquis once stable will need to continue 3 to 6 months  -8/6 start Eliquis  Essential HTN -BP meds initially held  secondary to hypotension restart Cardizem at a lower dose -8/6 Cardizem CD 120 mg daily  Hyponatremia -Mild asymptomatic continue to monitor  Transaminitis -Secondary to Covid -Resolved  Diabetes type 2 uncontrolled without complication -8/3 hemoglobin A1c= 7.1 -Lantus 8 units daily -NovoLog 5 units QAC -Follow-up with Dr. Redmond School in 2 weeks Covid pneumonia, diabetes type 2 uncontrolled without complication, acute DVT.  AKI Recent Labs  Lab 07/31/20 0500 08/01/20 0544 08/02/20 0401 08/03/20 0324 08/04/20 0434  CREATININE 1.02* 0.91 0.86 0.91 0.81  -Resolved  Hyperkalemia -Resolved  Discharge Diagnoses:  Principal Problem:   Pneumonia due to COVID-19 virus Active Problems:   Hypertension   Discharge Condition: Stable  Diet recommendation: Regular  Filed Weights   07/29/20 1207 07/29/20 2245 08/01/20 0741  Weight: 80 kg 68.2 kg 67.9 kg    History of present illness:  74 y.o.WF PMHx HTN and cervical cancer status post hysterectomy   Last week her husband was diagnosed with Covid infection when he was routinely tested at a hospital when he presented for treatment of hypoglycemia. Patient decided to get herself tested at the time and was also noted to be +7 days ago. Since that time patient states she has been progressively feeling worse she is not sure how. Patient states that her appetite is okay and that she has been urinating sufficiently. She does admit to feeling tired but denies overt shortness of breath. Patient denies that she is confused. When asked why she kept taking her oxygen off she stated "I cannot understand anybody with all the stuff on my face". Note she does not like having a mask on her face.  However states that she knows she is at [facility] for treatment of Covid.Her work-up was significant for hypoxia requiring oxygen, hyponatremia secondary to dehydration.   Hospital Course:  See above  Procedures: 8/1 CTA chest PE  protocol;-negative PE -Diffuse bilateral ground-glass opacities consistent with COVID-19 pneumonia. Moderate to advanced parenchymal involvement. -Mildly enlarged subcarinal and right hilar lymph nodes are likely reactive. -Coronary artery calcifications.   Antibiotics Anti-infectives (From admission, onward)   Start     Ordered Stop   07/30/20 1000  remdesivir 100 mg in sodium chloride 0.9 % 100 mL IVPB       "Followed by" Linked Group Details   07/29/20 1640 08/02/20 0930   07/29/20 1645  remdesivir 200 mg in sodium chloride 0.9% 250 mL IVPB       "Followed by" Linked Group Details   07/29/20 1640 07/29/20 1858       Discharge Exam: Vitals:   08/04/20 0538 08/04/20 0727 08/04/20 1100 08/04/20 1356  BP: (!) 158/75 (!) 153/77  (!) 118/59  Pulse: (!) 59 60  77  Resp: 16 19  20   Temp: 97.6 F (36.4 C) (!) 97.5 F (36.4 C)  (!) 97.5 F (36.4 C)  TempSrc: Oral Oral  Oral  SpO2: 94% 90% (!) 83% 98%  Weight:      Height:        General: A/O x4, positive acute respiratory distress Eyes: negative scleral hemorrhage, negative anisocoria, negative icterus ENT: Negative Runny nose, negative gingival bleeding, Neck:  Negative scars, masses, torticollis, lymphadenopathy, JVD Lungs: diffuse decreased breath sounds without wheezes, positive crackles Cardiovascular: Regular rate and rhythm without murmur gallop or rub normal S1 and S2   Discharge Instructions   Allergies as of 08/04/2020      Reactions   Losartan Nausea And Vomiting      Medication List    TAKE these medications   acetaminophen 325 MG tablet Commonly known as: TYLENOL Take 2 tablets (650 mg total) by mouth every 6 (six) hours as needed for mild pain or headache (fever >/= 101).   apixaban 5 MG Tabs tablet Commonly known as: ELIQUIS Take 2 tablets (10 mg total) by mouth 2 (two) times daily.   apixaban 5 MG Tabs tablet Commonly known as: ELIQUIS Take 1 tablet (5 mg total) by mouth 2 (two) times  daily. Start taking on: August 07, 2020   atorvastatin 40 MG tablet Commonly known as: LIPITOR Take 40 mg by mouth daily.   benzonatate 100 MG capsule Commonly known as: TESSALON Take 1 capsule (100 mg total) by mouth every 8 (eight) hours.   dexamethasone 10 MG/ML injection Commonly known as: DECADRON Inject 0.6 mLs (6 mg total) into the vein daily.   diltiazem 120 MG 24 hr capsule Commonly known as: CARDIZEM CD Take 120 mg by mouth daily.   insulin glargine 100 unit/mL Sopn Commonly known as: LANTUS Inject 0.08 mLs (8 Units total) into the skin daily.   Insulin Pen Needle 31G X 8 MM Misc Use as directed   NovoLOG FlexPen 100 UNIT/ML FlexPen Generic drug: insulin aspart Inject 5 Units into the skin 3 (three) times daily with meals.   pantoprazole 40 MG tablet Commonly known as: PROTONIX Take 1 tablet (40 mg total) by mouth daily. Start taking on: Aug 31, 2020            Durable Medical Equipment  (From admission, onward)         Start     Ordered  08/04/20 1454  For home use only DME oxygen  Once       Comments: SATURATION QUALIFICATIONS: (This note is used to comply with regulatory documentation for home oxygen) Patient Saturations on Room Air at Rest = 89% Patient Saturations on Room Air while Ambulating =82 % Patient Saturations on 6 Liters of oxygen while Ambulating = 82     % Please briefly explain why patient needs home oxygen: Patient dropped below 88% while on room air.  -Patient meets criteria for home O2 -Start patient on 6 L O2, titrate to maintain SPO2> 88%. -Provide Inogen home O2 concentrator  Question Answer Comment  Length of Need Lifetime   Mode or (Route) Nasal cannula   Liters per Minute 6   Frequency Continuous (stationary and portable oxygen unit needed)   Oxygen conserving device Yes   Oxygen delivery system Gas      08/04/20 1454         Allergies  Allergen Reactions  . Losartan Nausea And Vomiting    Contact  information for follow-up providers    [facility] Follow up.   Specialty: Home Health Services Contact information: [place]  [phone]            Contact information for after-discharge care    Destination    [facility] .   Service: Skilled Nursing Contact information: [address] [place] [phone]                   The results of significant diagnostics from this hospitalization (including imaging, microbiology, ancillary and laboratory) are listed below for reference.    Significant Diagnostic Studies: CT ANGIO CHEST PE W OR WO CONTRAST  Result Date: 07/29/2020 CLINICAL DATA:  PE suspected, high prob COVID positive. EXAM: CT ANGIOGRAPHY CHEST WITH CONTRAST TECHNIQUE: Multidetector CT imaging of the chest was performed using the standard protocol during bolus administration of intravenous contrast. Multiplanar CT image reconstructions and MIPs were obtained to evaluate the vascular anatomy. CONTRAST:  7mL OMNIPAQUE IOHEXOL 350 MG/ML SOLN COMPARISON:  Chest radiograph earlier this day. FINDINGS: Cardiovascular: There are no filling defects within the pulmonary arteries to suggest pulmonary embolus. Aortic atherosclerosis without aneurysm. There are coronary artery calcifications. Heart is normal in size. No pericardial effusion. Mediastinum/Nodes: Mildly enlarged subcarinal and right hilar lymph nodes. No visualized thyroid nodule. No esophageal wall thickening. Lungs/Pleura: Diffuse bilateral ground-glass opacities in the peripheral and slight basilar predominant distribution. Parenchymal involvement is moderate to advanced. There is no pleural fluid. No pneumothorax. Upper Abdomen: No acute or unexpected findings. 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 There are no acute or suspicious osseous abnormalities. Review of the MIP images confirms the  above findings. IMPRESSION: 1. No pulmonary embolus. 2. Diffuse bilateral ground-glass opacities consistent with COVID-19 pneumonia. Moderate to advanced parenchymal involvement. 3. Mildly enlarged subcarinal and right hilar lymph nodes are likely reactive. 4. Coronary artery calcifications. Aortic Atherosclerosis (ICD10-I70.0). Electronically Signed   By: Keith Rake M.D.   On: 07/29/2020 22:50   DG Chest Portable 1 View  Result Date: 07/29/2020 CLINICAL DATA:  COVID positive.  Hypoxia. EXAM: PORTABLE CHEST 1 VIEW COMPARISON:  08/17/2007 FINDINGS: 1338 hours. Low lung volumes. The cardiopericardial silhouette is within normal limits for size. Patchy areas of peripheral ground-glass attenuation are noted in the lungs bilaterally with mid and lower lung predominance. No associated pleural effusion. The visualized bony structures of the thorax show  now acute abnormality. Telemetry leads overlie the chest. IMPRESSION: Patchy areas of peripheral ground-glass opacity, compatible with the reported history of COVID infection. Electronically Signed   By: Misty Stanley M.D.   On: 07/29/2020 14:09   VAS [place] LOWER EXTREMITY VENOUS (DVT)  Result Date: 07/31/2020  Lower Venous DVT Study Indications: Covid-19, elevated D-dimer.  Comparison Study: No prior study on file Performing Technologist: Sharion Dove RVS  Examination Guidelines: A complete evaluation includes B-mode imaging, spectral Doppler, color Doppler, and power Doppler as needed of all accessible portions of each vessel. Bilateral testing is considered an integral part of a complete examination. Limited examinations for reoccurring indications may be performed as noted. The reflux portion of the exam is performed with the patient in reverse Trendelenburg.  +---------+---------------+---------+-----------+----------+--------------+ RIGHT    CompressibilityPhasicitySpontaneityPropertiesThrombus Aging  +---------+---------------+---------+-----------+----------+--------------+ CFV      Full           Yes      Yes                                 +---------+---------------+---------+-----------+----------+--------------+ SFJ      Full                                                        +---------+---------------+---------+-----------+----------+--------------+ FV Prox  Full                                                        +---------+---------------+---------+-----------+----------+--------------+ FV Mid   Full                                                        +---------+---------------+---------+-----------+----------+--------------+ FV DistalFull                                                        +---------+---------------+---------+-----------+----------+--------------+ PFV      Full                                                        +---------+---------------+---------+-----------+----------+--------------+ POP      Full           Yes      Yes                                 +---------+---------------+---------+-----------+----------+--------------+ PTV      Full                                                        +---------+---------------+---------+-----------+----------+--------------+  PERO     Full                                                        +---------+---------------+---------+-----------+----------+--------------+   +---------+---------------+---------+-----------+----------+--------------+ LEFT     CompressibilityPhasicitySpontaneityPropertiesThrombus Aging +---------+---------------+---------+-----------+----------+--------------+ CFV      Full           Yes      Yes                                 +---------+---------------+---------+-----------+----------+--------------+ SFJ      Full                                                         +---------+---------------+---------+-----------+----------+--------------+ FV Prox  Full                                                        +---------+---------------+---------+-----------+----------+--------------+ FV Mid   Full                                                        +---------+---------------+---------+-----------+----------+--------------+ FV DistalFull                                                        +---------+---------------+---------+-----------+----------+--------------+ PFV      Full                                                        +---------+---------------+---------+-----------+----------+--------------+ POP      Full           Yes      Yes                                 +---------+---------------+---------+-----------+----------+--------------+ PTV      Full                                                        +---------+---------------+---------+-----------+----------+--------------+ PERO     None  Acute          +---------+---------------+---------+-----------+----------+--------------+     Summary: RIGHT: - There is no evidence of deep vein thrombosis in the lower extremity.  LEFT: - Findings consistent with acute deep vein thrombosis involving the left peroneal veins.  *See table(s) above for measurements and observations. Electronically signed by Deitra Mayo MD on 07/31/2020 at 8:10:16 PM.    Final     Microbiology: No results found for this or any previous visit (from the past 240 hour(s)).   Labs: Basic Metabolic Panel: Recent Labs  Lab 07/31/20 0500 08/01/20 0544 08/02/20 0401 08/03/20 0324 08/04/20 0434  NA 132* 136 134* 134* 133*  K 3.8 4.4 4.7 5.8* 4.8  CL 98 102 100 99 100  CO2 23 24 23  21* 23  GLUCOSE 219* 168* 169* 199* 206*  BUN 38* 27* 23 22 22   CREATININE 1.02* 0.91 0.86 0.91 0.81  CALCIUM 8.4* 8.1* 8.3* 8.4* 8.4*  MG  --   --  2.6* 2.5*  2.3  PHOS 4.0  --  4.1 4.9* 4.7*   Liver Function Tests: Recent Labs  Lab 07/31/20 0500 08/01/20 0544 08/02/20 0401 08/03/20 0324 08/04/20 0434  AST 48* 31 31 34 20  ALT 52* 39 36 31 27  ALKPHOS 47 39 42 46 38  BILITOT 1.1 1.1 1.1 1.0 0.9  PROT 5.8* 5.3* 5.6* 6.1* 5.8*  ALBUMIN 2.6* 2.4* 2.4* 2.3* 2.5*   No results for input(s): LIPASE, AMYLASE in the last 168 hours. No results for input(s): AMMONIA in the last 168 hours. CBC: Recent Labs  Lab 07/31/20 0500 08/01/20 0544 08/02/20 0401 08/03/20 0324 08/04/20 0434  WBC 8.4 5.1 4.6 6.9 7.2  NEUTROABS 6.8 4.1 4.0 5.5 5.3  HGB 14.4 13.0 13.0 14.5 13.2  HCT 40.7 37.4 37.2 42.3 37.9  MCV 90.0 90.3 91.6 92.0 91.5  PLT 253 260 303 411* 396   Cardiac Enzymes: No results for input(s): CKTOTAL, CKMB, CKMBINDEX, TROPONINI in the last 168 hours. BNP: BNP (last 3 results) No results for input(s): BNP in the last 8760 hours.  ProBNP (last 3 results) No results for input(s): PROBNP in the last 8760 hours.  CBG: Recent Labs  Lab 08/03/20 1202 08/03/20 1555 08/03/20 2046 08/04/20 0812 08/04/20 1229  GLUCAP 220* 154* 266* 252* 186*       Signed:  Dia Crawford, MD [facility] [phone] pager

## 2020-08-04 NOTE — Progress Notes (Addendum)
SATURATION QUALIFICATIONS: (This note is used to comply with regulatory documentation for home oxygen)  Patient Saturations on Room Air at Rest = 89%  Patient Saturations on Room Air while Ambulating =82 %  Patient Saturations on 6 Liters of oxygen while Ambulating = 82 %  Please briefly explain why patient needs home oxygen: Patient dropped below 88% while on room air.

## 2020-08-05 ENCOUNTER — Emergency Department (HOSPITAL_COMMUNITY)
Admission: EM | Admit: 2020-08-05 | Discharge: 2020-08-29 | Disposition: E | Payer: PPO | Attending: Emergency Medicine | Admitting: Emergency Medicine

## 2020-08-05 ENCOUNTER — Other Ambulatory Visit: Payer: Self-pay

## 2020-08-05 ENCOUNTER — Encounter (HOSPITAL_COMMUNITY): Payer: Self-pay | Admitting: *Deleted

## 2020-08-05 DIAGNOSIS — J1282 Pneumonia due to coronavirus disease 2019: Secondary | ICD-10-CM | POA: Diagnosis not present

## 2020-08-05 DIAGNOSIS — I10 Essential (primary) hypertension: Secondary | ICD-10-CM | POA: Diagnosis not present

## 2020-08-05 DIAGNOSIS — Z7901 Long term (current) use of anticoagulants: Secondary | ICD-10-CM | POA: Diagnosis not present

## 2020-08-05 DIAGNOSIS — U071 COVID-19: Secondary | ICD-10-CM | POA: Diagnosis not present

## 2020-08-05 DIAGNOSIS — Z8541 Personal history of malignant neoplasm of cervix uteri: Secondary | ICD-10-CM | POA: Diagnosis not present

## 2020-08-05 DIAGNOSIS — I469 Cardiac arrest, cause unspecified: Secondary | ICD-10-CM | POA: Diagnosis not present

## 2020-08-05 DIAGNOSIS — Z794 Long term (current) use of insulin: Secondary | ICD-10-CM | POA: Diagnosis not present

## 2020-08-05 DIAGNOSIS — R4020 Unspecified coma: Secondary | ICD-10-CM | POA: Diagnosis present

## 2020-08-05 DIAGNOSIS — F17210 Nicotine dependence, cigarettes, uncomplicated: Secondary | ICD-10-CM | POA: Diagnosis not present

## 2020-08-05 DIAGNOSIS — Z79899 Other long term (current) drug therapy: Secondary | ICD-10-CM | POA: Diagnosis not present

## 2020-08-05 LAB — CBG MONITORING, ED: Glucose-Capillary: 160 mg/dL — ABNORMAL HIGH (ref 70–99)

## 2020-08-05 MED ORDER — SODIUM BICARBONATE 8.4 % IV SOLN
INTRAVENOUS | Status: AC | PRN
  Administered 2020-08-05: 50 meq via INTRAVENOUS

## 2020-08-05 MED ORDER — EPINEPHRINE 1 MG/10ML IJ SOSY
PREFILLED_SYRINGE | INTRAMUSCULAR | Status: AC | PRN
  Administered 2020-08-05 (×2): 1 mg via INTRAVENOUS

## 2020-08-05 MED FILL — Medication: Qty: 1 | Status: AC

## 2020-08-29 NOTE — Progress Notes (Signed)
[facility] is here to pick patient up and take her to [facility] . I gave report to [facility] staff and advised that I advised that my concern is that she will be returned tonight to the ED for shortness of breath. Advised that patient was here for COVID and prior to this she was at home with husband. Gave history and status of patient at this time which is shift change.  Advised patient in her room and ready to be transported and all her belongings are with her.

## 2020-08-29 NOTE — ED Notes (Signed)
Contacted [employer]. Pt is not a donor candidate per service rep, Page Harlow Ohms.   Reference number: [id number]

## 2020-08-29 NOTE — ED Notes (Signed)
CBG 

## 2020-08-29 NOTE — Progress Notes (Signed)
Advised provider that patient oxygen levels are in note that he requested. Advised him of the oxygen saturation levels when attempting to ambulate.  Tech reported that patient is hardly able to get up and ambulate and that there is concern about her leaving to SNF. I advised provider all information and advocated for patient to possibly stay for additional evaluation.

## 2020-08-29 NOTE — Consult Note (Signed)
I was called to the ED to be present with family. I introduced myself and spoke with two sons a brother and pt's husband. We talked prior to the Dr. Oneita Kras in to explain what happen.  The family was devastated at the news that their loved one had passed away. They asked for prayer but there was other family members outside the ED. So we went out and spoke with them. The chaplain offered caring and supportive presence, prayers and blessings and condolences. Family was grateful for the support.

## 2020-08-29 NOTE — ED Notes (Signed)
Call pts brother Venetia Maxon with updates. Family is waiting outside

## 2020-08-29 NOTE — ED Notes (Signed)
CBG 160 

## 2020-08-29 NOTE — ED Provider Notes (Addendum)
[facility] EMERGENCY DEPARTMENT Provider Note   CSN: [id number] Arrival date & time: 08-26-20  1011     History No chief complaint on file.  CPR in progress Taylor Banks is a 74 y.o. female.  HPI    74 year old female discharged from hospital after Covid pneumonia yesterday.  Patient at nursing home.  She was seen to be up and walking around and then found unresponsive.  EMS responded at 910.  He had CPR for 21 minutes.  She received epinephrine 5 rounds and was started on epinephrine drip.  She was intubated.  Then had bradycardia with palpable pulses.  Was hypotensive.  External pacing was started with capture.  Blood pressure was prior to coming into ED with systolic in the 94R.  Prehospital glucose was reported as normal.  Patient was unresponsive throughout.  She had an additional 30 minutes of transport time.  Past Medical History:  Diagnosis Date  . Cancer ([place])    cancerous cells in cervix -- had no treatments,just hysterectomy  . Carotid artery occlusion    Status post left CEA 2007 - Dr. Donnetta Hutching  . GERD (gastroesophageal reflux disease)    takes otc when she eats wrong foods  . Hyperlipidemia   . Hypertension   . Obesity     Patient Active Problem List   Diagnosis Date Noted  . Pneumonia due to COVID-19 virus 07/29/2020  . Hypertension   . Asymptomatic carotid artery stenosis, right 09/09/2017  . Aftercare following surgery of the circulatory system, [place] 02/01/2014  . Occlusion and stenosis of carotid artery without mention of cerebral infarction 02/01/2013    Past Surgical History:  Procedure Laterality Date  . ABDOMINAL HYSTERECTOMY     Partial  . CAROTID ENDARTERECTOMY Left 2007  . CATARACT EXTRACTION W/PHACO Left 10/24/2019   Procedure: CATARACT EXTRACTION PHACO AND INTRAOCULAR LENS PLACEMENT LEFT EYE;  Surgeon: Baruch Goldmann, MD;  Location: [facility];  Service: Ophthalmology;  Laterality: Left;  CDE: 5.58  . CATARACT EXTRACTION  W/PHACO Right 11/10/2019   Procedure: CATARACT EXTRACTION PHACO AND INTRAOCULAR LENS PLACEMENT (IOC) (CDE: 6.81);  Surgeon: Baruch Goldmann, MD;  Location: [facility];  Service: Ophthalmology;  Laterality: Right;  . DIAGNOSTIC MAMMOGRAM  Nov. 26,2013  . ENDARTERECTOMY Right 09/09/2017   Procedure: ENDARTERECTOMY CAROTID- RIGHT;  Surgeon: Rosetta Posner, MD;  Location: [facility];  Service: Vascular;  Laterality: Right;  . PATCH ANGIOPLASTY Right 09/09/2017   Procedure: PATCH ANGIOPLASTY USING HEMASHIELD PLATINUM FINESSE PATCH;  Surgeon: Rosetta Posner, MD;  Location: [facility];  Service: Vascular;  Laterality: Right;     OB History   No obstetric history on file.     Family History  Problem Relation Age of Onset  . Stroke Mother   . Hypertension Mother   . Stroke Father   . Diabetes Sister   . Diabetes Sister   . Heart disease Brother   . Heart disease Son     Social History   Tobacco Use  . Smoking status: Current Every Day Smoker    Packs/day: 1.00    Years: 35.00    Pack years: 35.00    Types: Cigarettes  . Smokeless tobacco: Never Used  . Tobacco comment: 'she's trying, used the patch, did go cold [place]  Vaping Use  . Vaping Use: Never used  Substance Use Topics  . Alcohol use: No  . Drug use: No    Home Medications Prior to Admission medications   Medication Sig Start Date  End Date Taking? Authorizing Provider  acetaminophen (TYLENOL) 325 MG tablet Take 2 tablets (650 mg total) by mouth every 6 (six) hours as needed for mild pain or headache (fever >/= 101). 08/04/20   Allie Bossier, MD  apixaban (ELIQUIS) 5 MG TABS tablet Take 2 tablets (10 mg total) by mouth 2 (two) times daily. 08/04/20   Allie Bossier, MD  apixaban (ELIQUIS) 5 MG TABS tablet Take 1 tablet (5 mg total) by mouth 2 (two) times daily. 08/07/20   Allie Bossier, MD  atorvastatin (LIPITOR) 40 MG tablet Take 40 mg by mouth daily. 06/15/20   [provider]  benzonatate (TESSALON) 100 MG capsule Take 1  capsule (100 mg total) by mouth every 8 (eight) hours. 07/22/20   Wurst, [place], PA-C  dexamethasone (DECADRON) 10 MG/ML injection Inject 0.6 mLs (6 mg total) into the vein daily. 08/04/20   Allie Bossier, MD  diltiazem (CARDIZEM CD) 120 MG 24 hr capsule Take 120 mg by mouth daily. 06/25/20   [provider]  insulin aspart (NOVOLOG FLEXPEN) 100 UNIT/ML FlexPen Inject 5 Units into the skin 3 (three) times daily with meals. 08/04/20   Allie Bossier, MD  insulin glargine (LANTUS) 100 unit/mL SOPN Inject 0.08 mLs (8 Units total) into the skin daily. 08/04/20   Allie Bossier, MD  Insulin Pen Needle 31G X 8 MM MISC Use as directed 08/04/20   Allie Bossier, MD  pantoprazole (PROTONIX) 40 MG tablet Take 1 tablet (40 mg total) by mouth daily. 08-30-20   Allie Bossier, MD    Allergies    Losartan  Review of Systems   Review of Systems  Unable to perform ROS: Acuity of condition    Physical Exam Updated Vital Signs There were no vitals taken for this visit.  Physical Exam Vitals and nursing note reviewed.  Constitutional:      Comments: Patient unresponsive pupils dilated  HENT:     Head: Normocephalic.     Right Ear: External ear normal.     Left Ear: External ear normal.     Nose: Nose normal.     Mouth/Throat:     Comments: ET tube in place Neck:     Comments: Neck externally visualized with no JVD and no acute abnormality noted Cardiovascular:     Comments: Asystolic with occasional agonal beats Pulmonary:     Comments: Patient with ET tube in place Good bilateral breath sounds Good fogging of tube and end-tidal CO2 No sounds over epigastrium Abdominal:     General: Abdomen is flat.     Palpations: Abdomen is soft.  Musculoskeletal:        General: Normal range of motion.  Skin:    General: Skin is dry.     ED Results / Procedures / Treatments   Labs (all labs ordered are listed, but only abnormal results are displayed) Labs Reviewed - No data to  display  EKG None  Radiology No results found.  Procedures Procedures (including critical care time)  Medications Ordered in ED Medications  EPINEPHrine (ADRENALIN) 1 MG/10ML injection (1 mg Intravenous Given 08/30/20 1017)    ED Course  I have reviewed the triage vital signs and the nursing notes.  Pertinent labs & imaging results that were available during my care of the patient were reviewed by me and considered in my medical decision making (see chart for details).    MDM Rules/Calculators/A&P  Patient with extended prehospital CPR with ROSC followed by paced rhythm. Here in the ED patient initially had no pulses.  She received another 2 rounds of epinephrine and bicarb.  She continued to be asystolic and pulseless.  CPR discontinued at 1021.  Nursing noted some agonal beats with pulse when running strip.   Discussed with ME and not ME case May 24, 2023 send death certificate to discharging MD or PMD 12:23 PM Family present - husband and sons informed of events  Final Clinical Impression(s) / ED Diagnoses Final diagnoses:  COVID-19 virus infection  Cardiac arrest [facility])    Rx / [place] Orders ED Discharge Orders    None       Pattricia Boss, MD 08-07-20 1103    Pattricia Boss, MD Aug 07, 2020 1223

## 2020-08-29 NOTE — ED Triage Notes (Signed)
PT from [facility] and was [place] placed yesterday WITH POSITIVE COVID. the patient was seen ambulatory this AM. Pt was found down with out pulse. EMS rt fore arm .by EMS . EMS gave a total 9 mg epi .EMS reported A-fib brady. BP98/56. ,CBG 257, PT had IO lt  placed RLE. [facility] started EPI gtt . Pt unresponsive ,intubated on arrival to room. Dr Jeanell Sparrow AT BED SIDE,

## 2020-08-29 DEATH — deceased

## 2020-12-20 IMAGING — MG DIGITAL SCREENING BILATERAL MAMMOGRAM WITH TOMO AND CAD
8 series · 8 of 24 positions shown · non-contrast
Comparison: Previous exam(s).

CLINICAL DATA: Screening.

EXAM:
DIGITAL SCREENING BILATERAL MAMMOGRAM WITH TOMO AND CAD

[L MLO synth-2D]
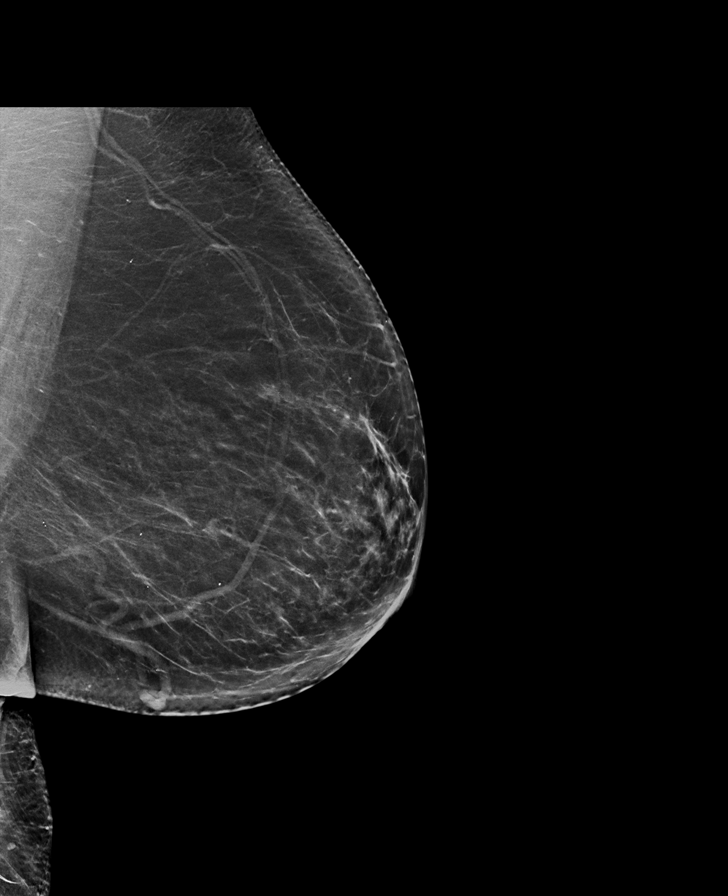

[L CC synth-2D]
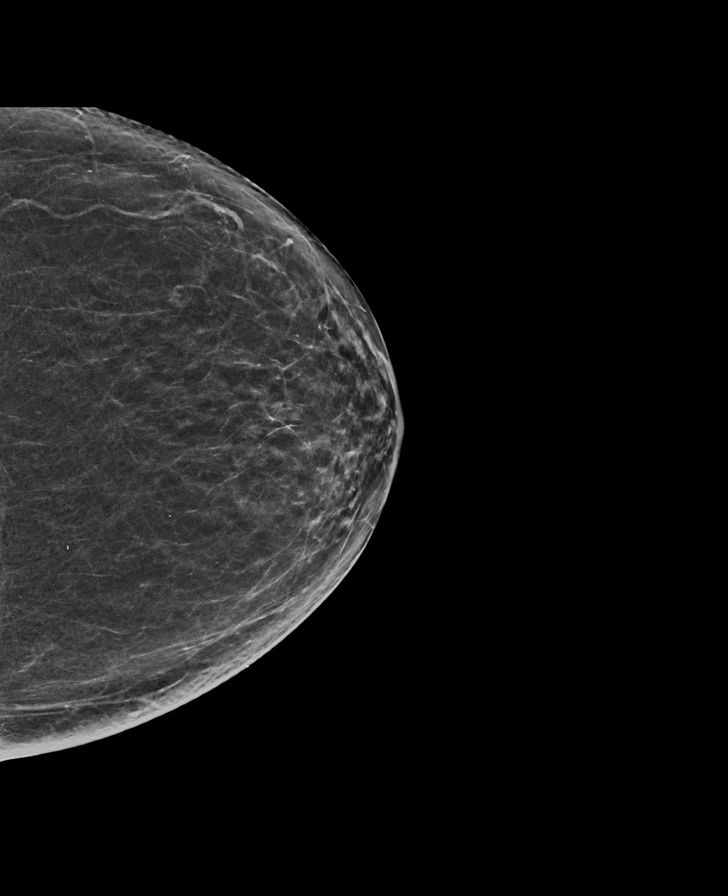

[R CC synth-2D]
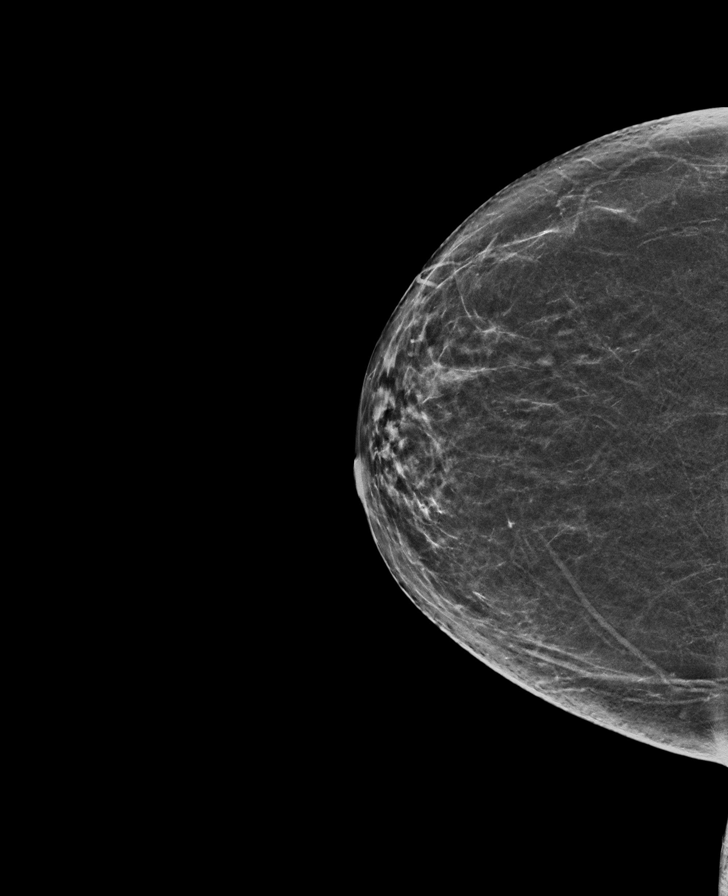

[R MLO synth-2D]
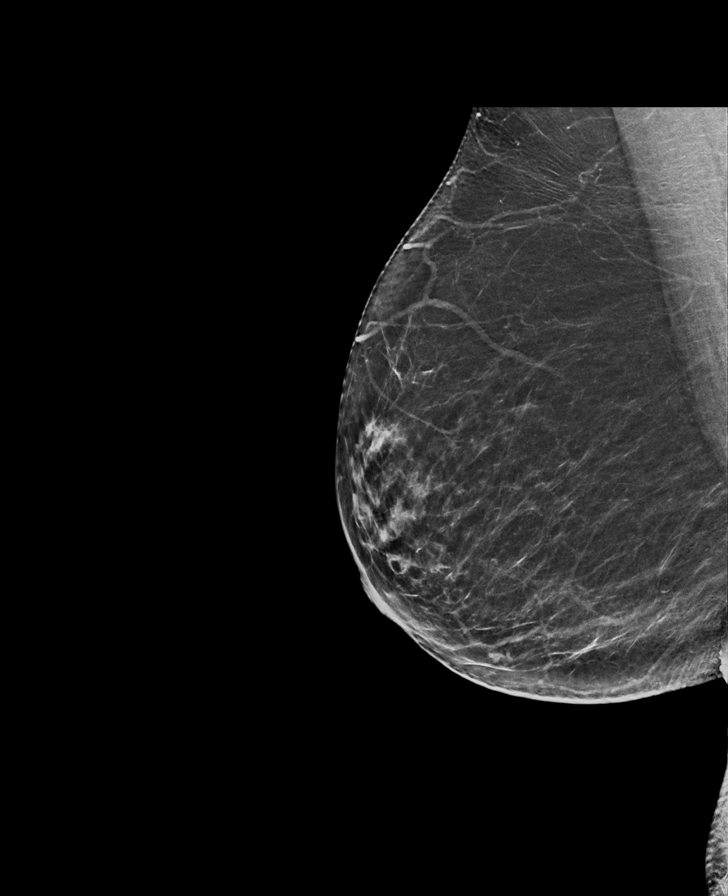

[L MLO tomo · tomo slice 37/73.0]
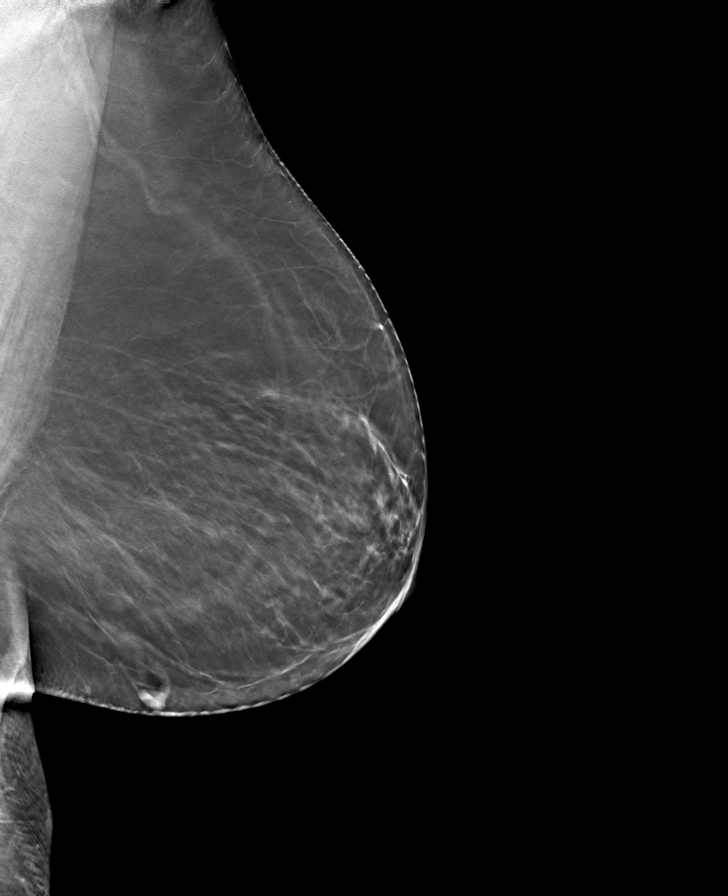

[L CC tomo · tomo slice 32/63.0]
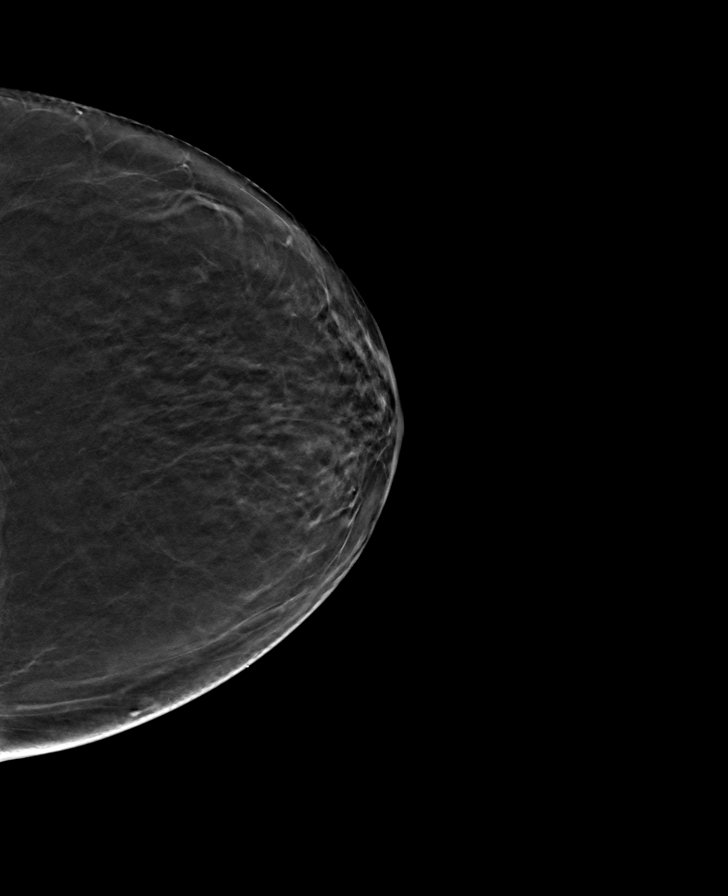

[R CC tomo · tomo slice 31/62.0]
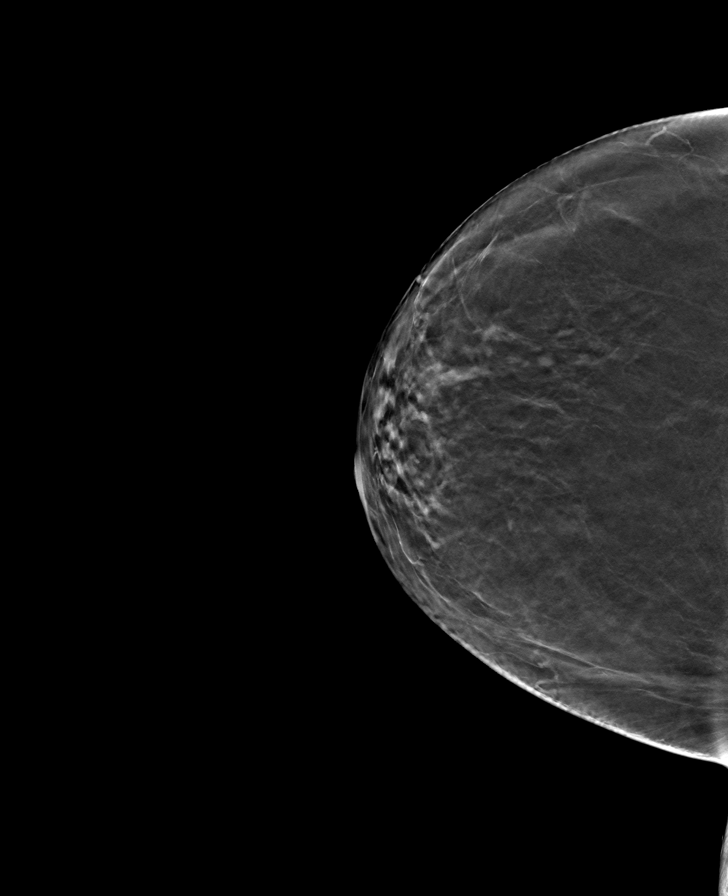

[R MLO tomo · tomo slice 34/67.0]
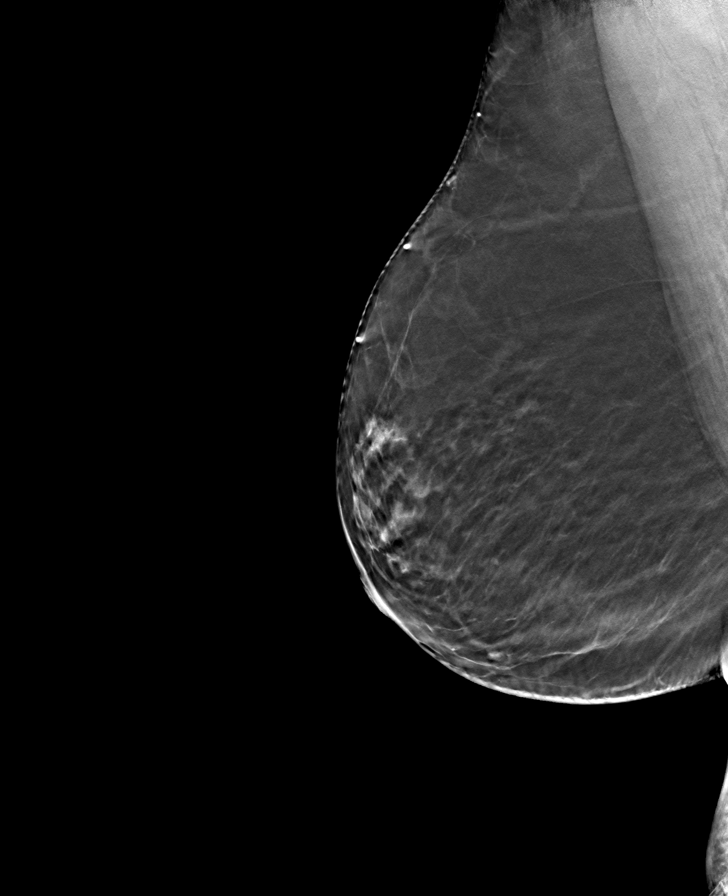

[8 of 24 positions shown; findings below may reference images not displayed]

ACR Breast Density Category b: There are scattered areas of
fibroglandular density.
FINDINGS: There are no findings suspicious for malignancy. Images were
processed with CAD.
IMPRESSION: No mammographic evidence of malignancy. A result letter of this
screening mammogram will be mailed directly to the patient.

RECOMMENDATION:
Screening mammogram in one year. (Code:[id number])

BI-RADS CATEGORY  1: Negative.

## 2021-12-29 IMAGING — CT CT ANGIO CHEST
2 of 6 series · 18 of 46 positions shown · IV contrast (omnipaque)
Comparison: Chest radiograph earlier this day.

CLINICAL DATA: PE suspected, high prob

COVID positive.
EXAM:
CT ANGIOGRAPHY CHEST WITH CONTRAST
TECHNIQUE: Multidetector CT imaging of the chest was performed using the
standard protocol during bolus administration of intravenous
contrast. Multiplanar CT image reconstructions and MIPs were
obtained to evaluate the vascular anatomy.
CONTRAST:  75mL OMNIPAQUE IOHEXOL 350 MG/ML SOLN

[Series 6: thins · axial · 0.67mm/px · z∈[-471,-242]mm · 15 of 251 slices shown]
[im 11/251  lung]
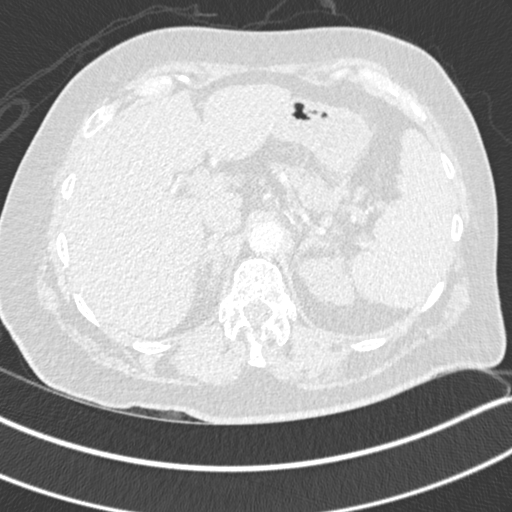
[im 33/251  soft-tissue]
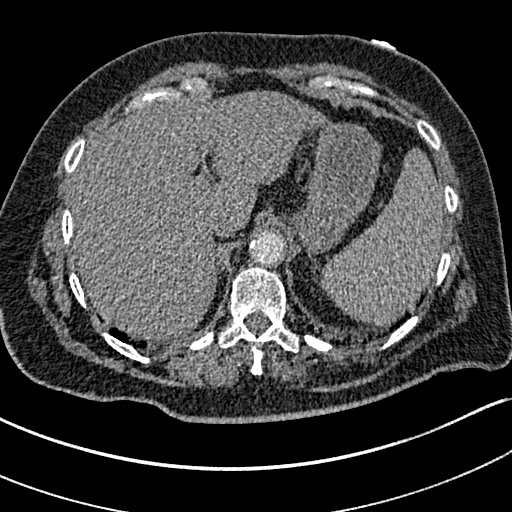
[im 44/251  lung]
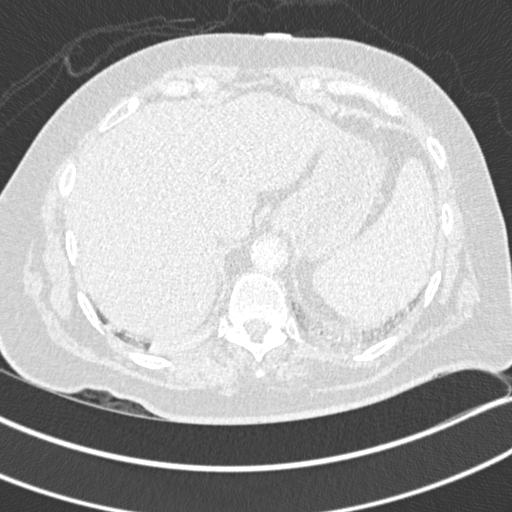
[im 66/251  soft-tissue]
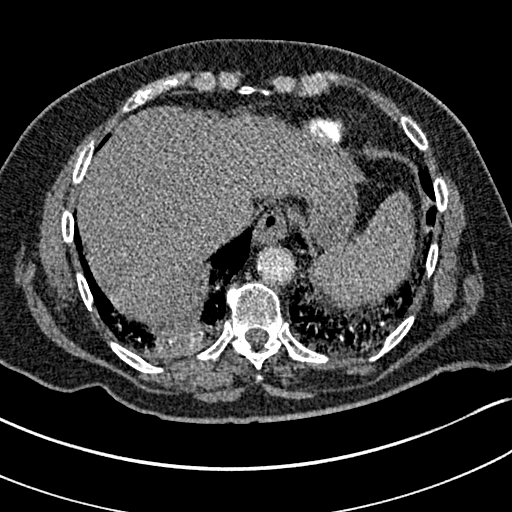
[im 77/251  lung]
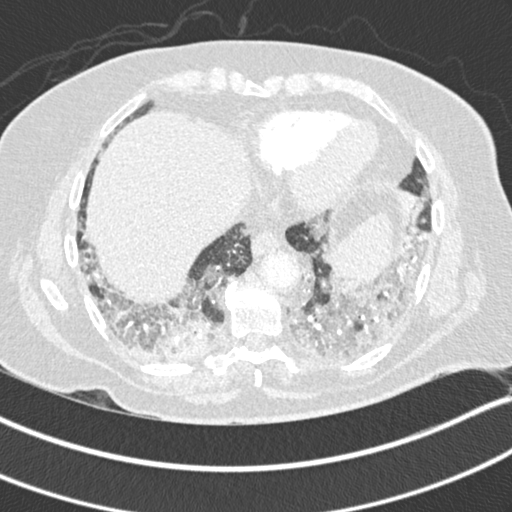
[im 98/251  soft-tissue]
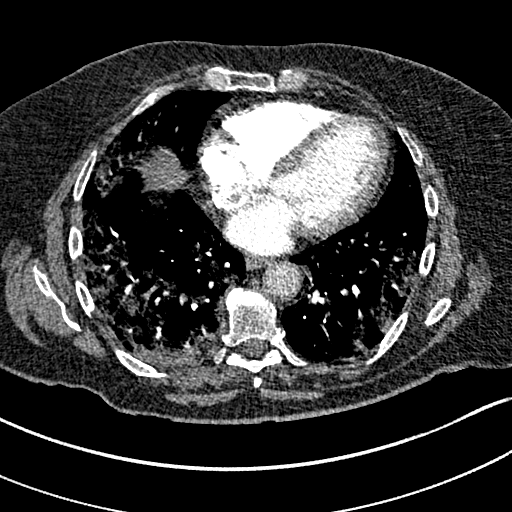
[im 109/251  lung]
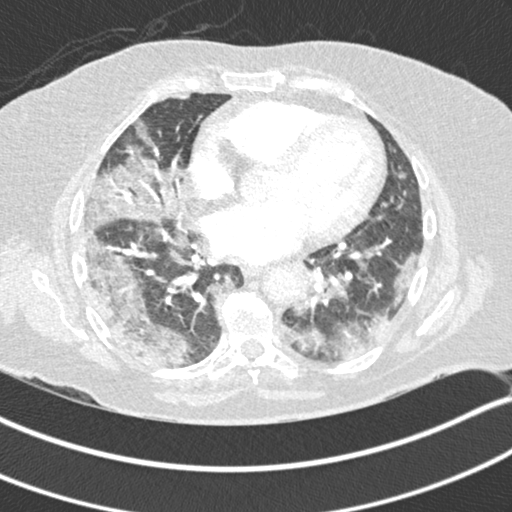
[im 131/251  soft-tissue]
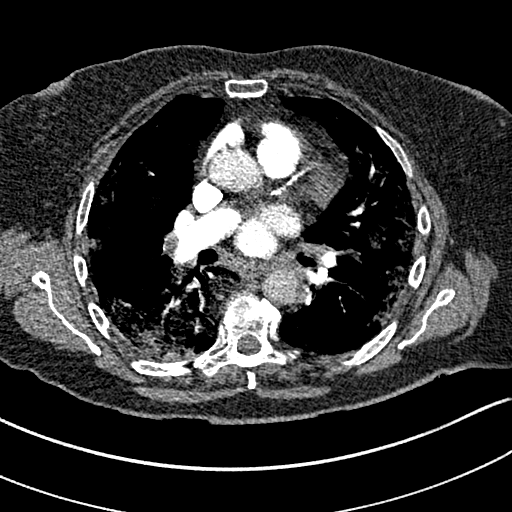
[im 142/251  lung]
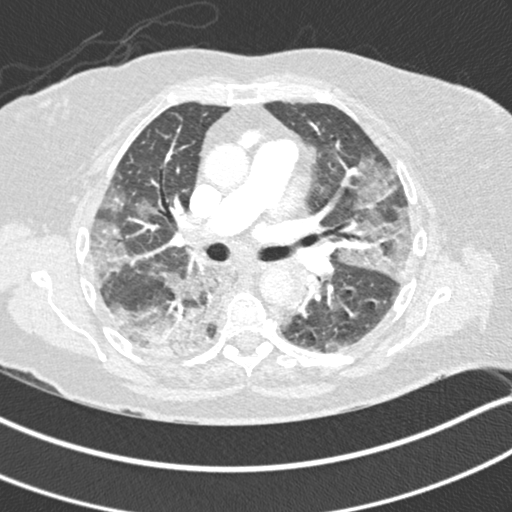
[im 153/251  soft-tissue]
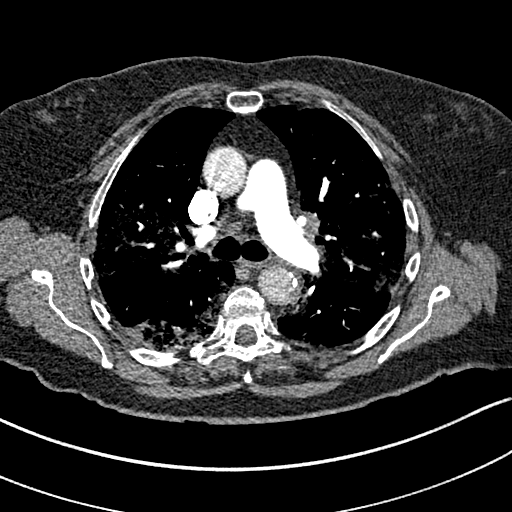
[im 174/251  lung]
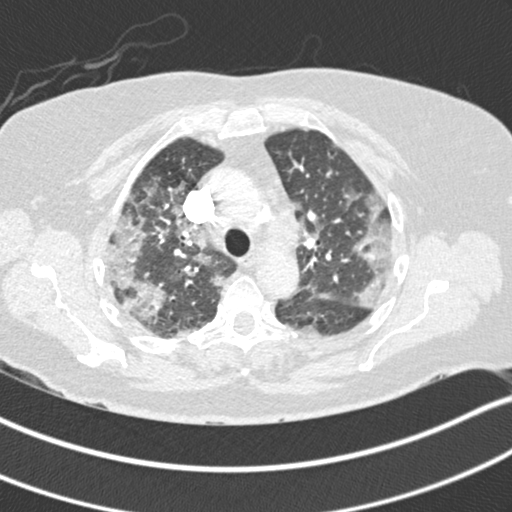
[im 185/251  soft-tissue]
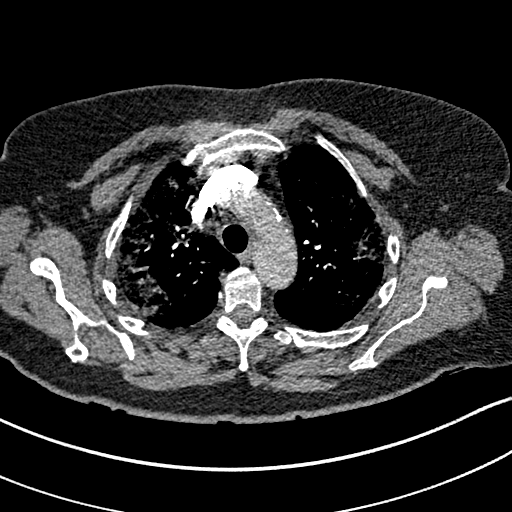
[im 207/251  lung]
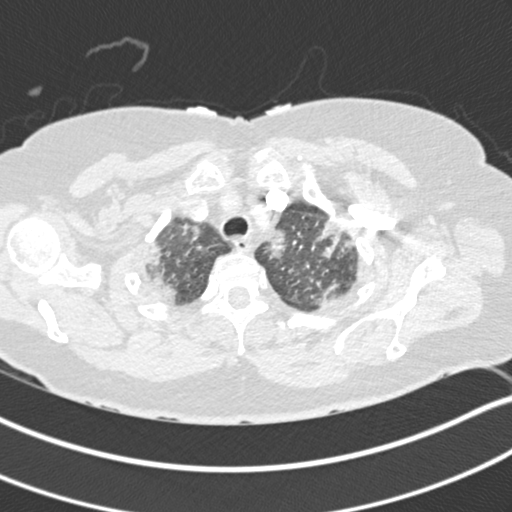
[im 218/251  soft-tissue]
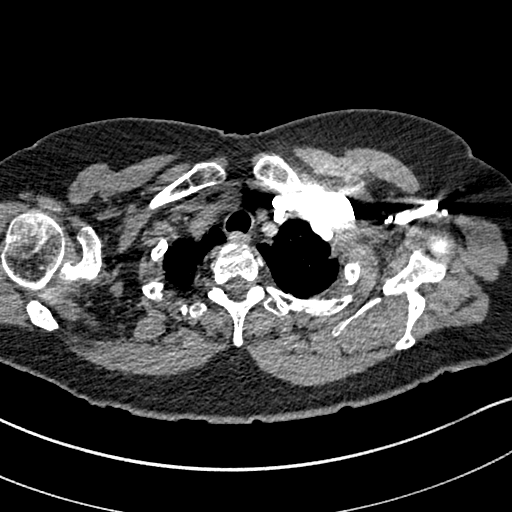
[im 240/251  lung]
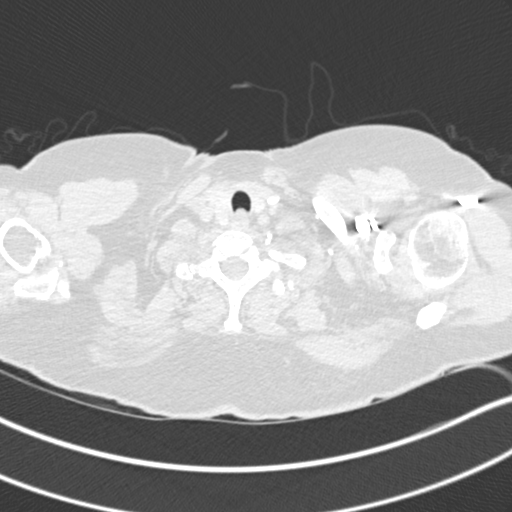

[Series 8: coronal mpr · coronal · 0.52mm/px · 3 of 127 slices shown]
[im 32/127  soft-tissue]
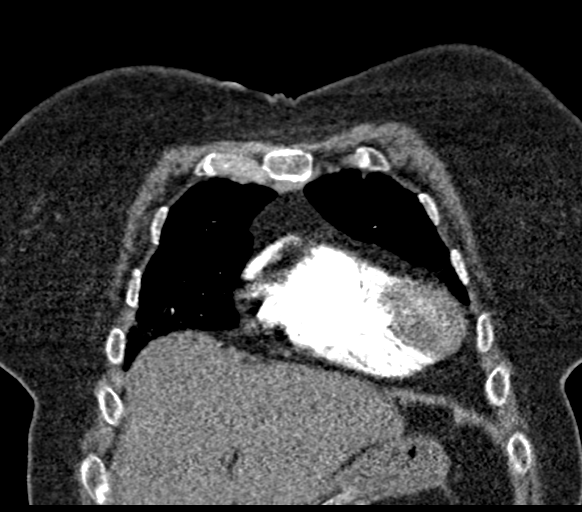
[im 64/127  soft-tissue]
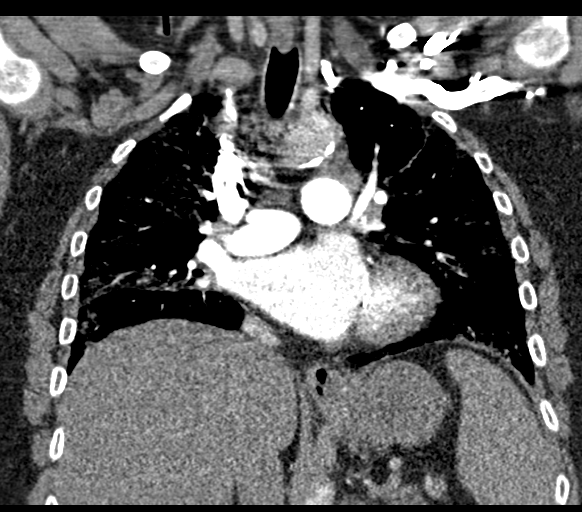
[im 95/127  soft-tissue]
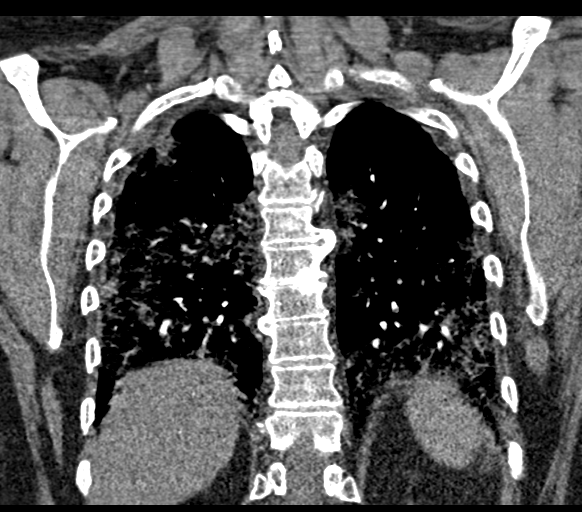

[18 of 46 positions shown; findings below may reference images not displayed]

FINDINGS: Cardiovascular: There are no filling defects within the pulmonary
arteries to suggest pulmonary embolus. Aortic atherosclerosis
without aneurysm. There are coronary artery calcifications. Heart is
normal in size. No pericardial effusion.

Mediastinum/Nodes: Mildly enlarged subcarinal and right hilar lymph
nodes. No visualized thyroid nodule. No esophageal wall thickening.

Lungs/Pleura: Diffuse bilateral ground-glass opacities in the
peripheral and slight basilar predominant distribution. Parenchymal
involvement is moderate to advanced. There is no pleural fluid. No
pneumothorax.

Upper Abdomen: No acute or unexpected findings.

Musculoskeletal: Degenerative disc disease in the mid lower spine.
There are no acute or suspicious osseous abnormalities.

Review of the MIP images confirms the above findings.
IMPRESSION: 1. No pulmonary embolus.
2. Diffuse bilateral ground-glass opacities consistent with [id number]
pneumonia. Moderate to advanced parenchymal involvement.
3. Mildly enlarged subcarinal and right hilar lymph nodes are likely
reactive.
4. Coronary artery calcifications.

Aortic Atherosclerosis ([id number]).
